# Patient Record
Sex: Female | Born: 1950 | Race: White | Marital: Married | Smoking: Former smoker
Health system: Southern US, Community
[De-identification: ages and names within clinical notes are randomized; demographics above are authoritative.]

## PROBLEM LIST (undated history)

## (undated) DIAGNOSIS — K5289 Other specified noninfective gastroenteritis and colitis: Secondary | ICD-10-CM

## (undated) DIAGNOSIS — Z860100 Personal history of colon polyps, unspecified: Secondary | ICD-10-CM

## (undated) DIAGNOSIS — E785 Hyperlipidemia, unspecified: Secondary | ICD-10-CM

## (undated) DIAGNOSIS — C541 Malignant neoplasm of endometrium: Secondary | ICD-10-CM

## (undated) DIAGNOSIS — M8580 Other specified disorders of bone density and structure, unspecified site: Secondary | ICD-10-CM

## (undated) DIAGNOSIS — K5190 Ulcerative colitis, unspecified, without complications: Secondary | ICD-10-CM

## (undated) HISTORY — DX: Other specified noninfective gastroenteritis and colitis: K52.89

## (undated) HISTORY — DX: Other specified disorders of bone density and structure, unspecified site: M85.80

## (undated) HISTORY — PX: ABDOMINAL HYSTERECTOMY: SHX81

## (undated) HISTORY — DX: Hyperlipidemia, unspecified: E78.5

## (undated) HISTORY — DX: Ulcerative colitis, unspecified, without complications: K51.90

## (undated) HISTORY — PX: BREAST EXCISIONAL BIOPSY: SUR124

## (undated) HISTORY — DX: Personal history of colonic polyps: Z86.010

## (undated) HISTORY — DX: Malignant neoplasm of endometrium: C54.1

## (undated) HISTORY — DX: Personal history of colon polyps, unspecified: Z86.0100

---

## 2012-09-14 ENCOUNTER — Encounter: Payer: Self-pay | Admitting: Physician Assistant

## 2012-10-18 ENCOUNTER — Ambulatory Visit (INDEPENDENT_AMBULATORY_CARE_PROVIDER_SITE_OTHER): Payer: BC Managed Care – PPO | Admitting: Gastroenterology

## 2012-10-18 ENCOUNTER — Encounter: Payer: Self-pay | Admitting: Gastroenterology

## 2012-10-18 VITALS — BP 120/68 | HR 72 | Wt 128.8 lb

## 2012-10-18 DIAGNOSIS — K5190 Ulcerative colitis, unspecified, without complications: Secondary | ICD-10-CM

## 2012-10-18 NOTE — Progress Notes (Signed)
HPI: This is a     very pleasant 61 year old woman whom I am meeting for the first time today.  Recently moved from [place].  Retired Pharmacist, hospital.  Was diagnosed with UC in 1986, with diarrhea, bloody at times  Currently on lialda since 2007, previously on sulfasalazine but weaned off.  Uterine cancer, treated with pelvic radiation and surgery.  Did not require chemo.  Radiation was in 2006.  I reviewed colonoscopy reports from annually 2007 through 2012. His last colonoscopy was December 2012. These have all shown "evidence of colitis in the colon, multiple different segments. I never see any documentation severe he macroscopic inflammation. 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 No evidence of granuloma.  On 2 pills lialda once a day.  On that regimine she is fine.  No bleeding, no diarrhea.  Still has diet related issues with BMs.  Usually one formed BM daily.  Rarely takes NSAIDs.  Difficulty with bleeding hemorrhoid.  No pain, no itching.  Will use suppository periodically, and these help.    Review of systems: Pertinent positive and negative review of systems were noted in the above HPI section. Complete review of systems was performed and was otherwise normal.    Past Medical History  Diagnosis Date  . Other and unspecified noninfectious gastroenteritis and colitis   . History of colon polyps   . Endometrial cancer     Past Surgical History  Procedure Date  . Abdominal hysterectomy     Current Outpatient Prescriptions  Medication Sig Dispense Refill  . calcium carbonate (OS-CAL) 600 MG TABS Take 600 mg by mouth daily.      . cholecalciferol (VITAMIN D) 1000 UNITS tablet Take 1,000 Units by mouth daily.      . mesalamine (LIALDA) 1.2 G EC tablet Take 1,200 mg by mouth 3 (three) times daily.      . Multiple Vitamin (MULTIVITAMIN) tablet Take 1 tablet by mouth daily.      . Psyllium (FIBER)  0.52 G CAPS Take 2 capsules by mouth daily.        Allergies as of 10/18/2012  . (No Known Allergies)    Family History  Problem Relation Age of Onset  . Cancer Mother     Blood    History   Social History  . Marital Status: Married    Spouse Name: N/A    Number of Children: 2  . Years of Education: N/A   Occupational History  . Retired    Social History Main Topics  . Smoking status: Former Research scientist (life sciences)  . Smokeless tobacco: Never Used  . Alcohol Use: Yes     Comment: wine  . Drug Use: No  . Sexually Active: Not on file   Other Topics Concern  . Not on file   Social History Narrative  . No narrative on file       Physical Exam: BP 120/68  Pulse 72  Wt 128 lb 12.8 oz (58.423 kg) Constitutional: generally well-appearing Psychiatric: alert and oriented x3 Eyes: extraocular movements intact Mouth: oral pharynx moist, no lesions Neck: supple no lymphadenopathy Cardiovascular: heart regular rate and rhythm Lungs: clear to auscultation bilaterally Abdomen: soft, nontender, nondistended, no obvious ascites, no peritoneal signs, normal bowel sounds Extremities: no lower extremity edema bilaterally Skin: no lesions on visible extremities Rectal exam with femal assistant in the room: 2-3 small nonthrombosed external hemorrhoids. No clear blood present, internal examination found no  distal masses in the rectum.   Assessment and plan: 62 y.o. female with  long-standing ulcerative colitis under very good control on half dose mesalamine  She has been getting colonoscopies once a year every year for the past 6 years. Reviewing literature recently, gastroenterology societies are providing guidelines that showed is safe to space out these surveillance colonoscopies even up to 3 years.   Her symptoms are under very good control currently. She'll continue only Alda at 2 pills per day and I'll refill them when needed. She'll return to see me in 6 months and sooner if needed. I  think it is reasonable to space out her surveillance intervals. We will start with a colonoscopy December 2014 which be 2 years from her last one.

## 2012-10-18 NOTE — Patient Instructions (Addendum)
Continue as needed OTC treatments for hemorrhoids. Return office visit in 6 months. Will refill your lialda (2 pills a day) when needed. Recall colonoscopy 10/2013.

## 2012-11-30 ENCOUNTER — Other Ambulatory Visit: Payer: Self-pay | Admitting: Internal Medicine

## 2012-11-30 DIAGNOSIS — Z1231 Encounter for screening mammogram for malignant neoplasm of breast: Secondary | ICD-10-CM

## 2012-12-26 ENCOUNTER — Ambulatory Visit
Admission: RE | Admit: 2012-12-26 | Discharge: 2012-12-26 | Disposition: A | Discharge status: Home / Self Care | Payer: BC Managed Care – PPO | Source: Ambulatory Visit | Attending: Internal Medicine | Admitting: Internal Medicine

## 2012-12-26 DIAGNOSIS — Z1231 Encounter for screening mammogram for malignant neoplasm of breast: Secondary | ICD-10-CM

## 2013-04-10 ENCOUNTER — Ambulatory Visit: Payer: BC Managed Care – PPO | Admitting: Gastroenterology

## 2013-04-28 ENCOUNTER — Encounter: Payer: Self-pay | Admitting: Gastroenterology

## 2013-04-28 ENCOUNTER — Ambulatory Visit (INDEPENDENT_AMBULATORY_CARE_PROVIDER_SITE_OTHER): Payer: BC Managed Care – PPO | Admitting: Gastroenterology

## 2013-04-28 VITALS — BP 90/60 | HR 76 | Ht 62.25 in | Wt 130.5 lb

## 2013-04-28 DIAGNOSIS — K5190 Ulcerative colitis, unspecified, without complications: Secondary | ICD-10-CM

## 2013-04-28 NOTE — Patient Instructions (Addendum)
Continue on lialda 2 pills daily for now. Colonoscopy December 2014 for surveillance.

## 2013-04-28 NOTE — Progress Notes (Signed)
Review of pertinent gastrointestinal problems: 1. Ulcerative colitis: pan colitis:diagnosed with UC in 1986, with diarrhea, bloody at times Currently on lialda since 2007, previously on sulfasalazine but weaned off. [place] from annually 2007 through 2012. Her last colonoscopy was December 2012. These have all shown "evidence of colitis in the colon, multiple different segments". I never see any documentation severity of macroscopic inflammation. 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 No evidence of granuloma.  Established care with Dr. Scheryl Marten 2013, decided to repeat colonoscopy 12/14 for surveillance, continue lialda.  IBD description:  (location, activity level, extraintestinal manifestations)  Corticosteroid sparing therapy prescribed: yes  Bone loss assessment  Vitamin D 25-OH level (has been done by PCP):   Bone Density Assessment (she does it every 2 years):   TB testing prior to Anti TNF therapy: NA  Hep B surface Ag, Hep B surface Antibody before Anti TNF therapy:  NA  Influenza immunization (annually)  Pneumococcal immunization (once)  Tobacco Screening and counciling (non smoker)   HPI: This is a very pleasant 62 year old woman whom I last saw November 2013. She has chronic ulcerative colitis under good control on 2.4 g of mesalamine daily.  She has been feeling great.  Only small red blood on TP only.  Solid, formed, brown stools.  Usually once per morning.  arerobics in AM.  Recently had bone density test via PCP, mild osteopenia. Started vit D, calcium supplement   Past Medical History  Diagnosis Date  . Other and unspecified noninfectious gastroenteritis and colitis(558.9)   . History of colon polyps   . Endometrial cancer     Past Surgical History  Procedure Laterality Date  . Abdominal hysterectomy      Current Outpatient Prescriptions  Medication Sig  Dispense Refill  . calcium carbonate (OS-CAL) 600 MG TABS Take 600 mg by mouth daily.      . cholecalciferol (VITAMIN D) 1000 UNITS tablet Take 1,000 Units by mouth daily.      . mesalamine (LIALDA) 1.2 G EC tablet Take 2.4 g by mouth daily.       . Multiple Vitamin (MULTIVITAMIN) tablet Take 1 tablet by mouth daily.      . Psyllium (FIBER) 0.52 G CAPS Take 2 capsules by mouth daily.       No current facility-administered medications for this visit.    Allergies as of 04/28/2013  . (No Known Allergies)    Family History  Problem Relation Age of Onset  . Cancer Mother     Blood    History   Social History  . Marital Status: Married    Spouse Name: N/A    Number of Children: 2  . Years of Education: N/A   Occupational History  . Retired    Social History Main Topics  . Smoking status: Former Research scientist (life sciences)  . Smokeless tobacco: Never Used  . Alcohol Use: Yes     Comment: wine  . Drug Use: No  . Sexually Active: Not on file   Other Topics Concern  . Not on file   Social History Narrative  . No narrative on file      Physical Exam: BP 90/60  Pulse 76  Ht 5' 2.25" (1.581 m)  Wt 130 lb 8 oz (59.194 kg)  BMI 23.68 kg/m2 Constitutional: generally well-appearing Psychiatric: alert and oriented x3 Abdomen: soft, nontender, nondistended, no obvious ascites, no peritoneal signs, normal bowel sounds  Assessment and plan: 62 y.o. female with chronic ulcerative colitis under good control on mesalamine  She will continue on lialda 2.4 g once daily. She is due for surveillance colonoscopy later this year. I see no reason for any further testing prior to then.

## 2013-08-17 ENCOUNTER — Encounter: Payer: Self-pay | Admitting: Gastroenterology

## 2013-08-22 ENCOUNTER — Encounter: Payer: Self-pay | Admitting: Gastroenterology

## 2013-09-12 ENCOUNTER — Other Ambulatory Visit: Payer: Self-pay | Admitting: Internal Medicine

## 2013-09-12 DIAGNOSIS — R102 Pelvic and perineal pain: Secondary | ICD-10-CM

## 2013-09-12 DIAGNOSIS — R7989 Other specified abnormal findings of blood chemistry: Secondary | ICD-10-CM

## 2013-09-14 ENCOUNTER — Other Ambulatory Visit: Payer: Self-pay | Admitting: Internal Medicine

## 2013-09-14 ENCOUNTER — Ambulatory Visit
Admission: RE | Admit: 2013-09-14 | Discharge: 2013-09-14 | Disposition: A | Discharge status: Home / Self Care | Payer: BC Managed Care – PPO | Source: Ambulatory Visit | Attending: Internal Medicine | Admitting: Internal Medicine

## 2013-09-14 DIAGNOSIS — R7989 Other specified abnormal findings of blood chemistry: Secondary | ICD-10-CM

## 2013-09-14 DIAGNOSIS — K7689 Other specified diseases of liver: Secondary | ICD-10-CM

## 2013-09-14 MED ORDER — GADOXETATE DISODIUM 0.25 MMOL/ML IV SOLN
6.0000 mL | Freq: Once | INTRAVENOUS | Status: AC | PRN
  Administered 2013-09-14: 6 mL via INTRAVENOUS

## 2013-09-22 ENCOUNTER — Other Ambulatory Visit: Payer: BC Managed Care – PPO

## 2013-10-06 ENCOUNTER — Ambulatory Visit (AMBULATORY_SURGERY_CENTER): Payer: Self-pay | Admitting: *Deleted

## 2013-10-06 VITALS — Ht 62.0 in | Wt 129.2 lb

## 2013-10-06 DIAGNOSIS — K5190 Ulcerative colitis, unspecified, without complications: Secondary | ICD-10-CM

## 2013-10-06 MED ORDER — MOVIPREP 100 G PO SOLR: 1.0000 | Freq: Once | ORAL | Status: DC

## 2013-10-06 NOTE — Progress Notes (Signed)
No egg or soy allergy. No anesthesia problems.  

## 2013-10-09 ENCOUNTER — Encounter: Payer: Self-pay | Admitting: Gastroenterology

## 2013-10-27 ENCOUNTER — Ambulatory Visit (AMBULATORY_SURGERY_CENTER): Payer: BC Managed Care – PPO | Admitting: Gastroenterology

## 2013-10-27 ENCOUNTER — Encounter: Payer: Self-pay | Admitting: Gastroenterology

## 2013-10-27 VITALS — BP 110/73 | HR 63 | Temp 96.9°F | Resp 18 | Ht 62.0 in | Wt 129.0 lb

## 2013-10-27 DIAGNOSIS — K5190 Ulcerative colitis, unspecified, without complications: Secondary | ICD-10-CM

## 2013-10-27 DIAGNOSIS — R933 Abnormal findings on diagnostic imaging of other parts of digestive tract: Secondary | ICD-10-CM

## 2013-10-27 MED ORDER — MESALAMINE 1.2 G PO TBEC: 4.8000 g | DELAYED_RELEASE_TABLET | Freq: Every day | ORAL | Status: DC

## 2013-10-27 MED ORDER — MESALAMINE 1.2 G PO TBEC: 1.2000 g | DELAYED_RELEASE_TABLET | Freq: Every day | ORAL | Status: DC

## 2013-10-27 MED ORDER — SODIUM CHLORIDE 0.9 % IV SOLN: 500.0000 mL | INTRAVENOUS | Status: DC

## 2013-10-27 MED ORDER — MESALAMINE 1.2 G PO TBEC: 2.4000 g | DELAYED_RELEASE_TABLET | Freq: Every day | ORAL | Status: DC

## 2013-10-27 NOTE — Op Note (Signed)
[facility]  [address]. [place], [address]   COLONOSCOPY PROCEDURE REPORT  PATIENT: Autumn Knight, Autumn Knight  MR#: [id number] BIRTHDATE: [birth year] , 62  yrs. old GENDER: Female ENDOSCOPIST: Milus Banister, MD PROCEDURE DATE:  10/27/2013 PROCEDURE:   Colonoscopy with biopsy First Screening Colonoscopy - Avg.  risk and is 50 yrs.  old or older - No.  Prior Negative Screening - Now for repeat screening. N/A  History of Adenoma - Now for follow-up colonoscopy & has been > or = to 3 yrs.  N/A  Polyps Removed Today? No.  Recommend repeat exam, <10 yrs? Yes.  High risk (family or personal hx). ASA CLASS:   Class II INDICATIONS:ulcerative colitis since 1986. MEDICATIONS: Fentanyl 75 mcg IV, Versed 7 mg IV, and These medications were titrated to patient response per physician's verbal order  DESCRIPTION OF PROCEDURE:   After the risks benefits and alternatives of the procedure were thoroughly explained, informed consent was obtained.  A digital rectal exam revealed no abnormalities of the rectum.   The LB [id number]  endoscope was introduced through the anus and advanced to the terminal ileum which was intubated for a short distance. No adverse events experienced.   The quality of the prep was excellent.  The instrument was then slowly withdrawn as the colon was fully examined.  COLON FINDINGS: The terminal ileum was normal.  The right colon was mildly to moderately inflamed.  Multiple biopsies were randomly taken and sent to pathology.  The left colon mucosa was normal appearing, random biopsies were also taken and sent to pathology. The examination was otherwise normal.  Retroflexed views revealed no abnormalities. The time to cecum=7 minutes 39 seconds. Withdrawal time=9 minutes 59 seconds.  The scope was withdrawn and the procedure completed. COMPLICATIONS: There were no complications.  ENDOSCOPIC IMPRESSION: The terminal ileum was normal.  The right colon was  mildly to moderately inflamed.  Multiple biopsies were randomly taken and sent to pathology.  The left colon mucosa was normal appearing, random biopsies were also taken and sent to pathology.  The examination was otherwise normal.  RECOMMENDATIONS: Await biopsy results.  Given the overt inflammation, I will likely be recommending that you increase your lialda (to 4.8gm per day). Surveillance colonoscopy in 2-3 years pending results as well.   eSigned:  Milus Banister, MD 10/27/2013 11:18 AM   cc: Deland Pretty, MD

## 2013-10-27 NOTE — Progress Notes (Signed)
Patient did not have preoperative order for IV antibiotic SSI prophylaxis. (G8918)  Patient did not experience any of the following events: a burn prior to discharge; a fall within the facility; wrong site/side/patient/procedure/implant event; or a hospital transfer or hospital admission upon discharge from the facility. (G8907)  

## 2013-10-27 NOTE — Patient Instructions (Addendum)
YOU HAD AN ENDOSCOPIC PROCEDURE TODAY AT [facility]: Refer to the procedure report that was given to you for any specific questions about what was found during the examination.  If the procedure report does not answer your questions, please call your gastroenterologist to clarify.  If you requested that your care partner not be given the details of your procedure findings, then the procedure report has been included in a sealed envelope for you to review at your convenience later.  YOU SHOULD EXPECT: Some feelings of bloating in the abdomen. Passage of more gas than usual.  Walking can help get rid of the air that was put into your GI tract during the procedure and reduce the bloating. If you had a lower endoscopy (such as a colonoscopy or flexible sigmoidoscopy) you may notice spotting of blood in your stool or on the toilet paper. If you underwent a bowel prep for your procedure, then you may not have a normal bowel movement for a few days.  DIET: Your first meal following the procedure should be a light meal and then it is ok to progress to your normal diet.  A half-sandwich or bowl of soup is an example of a good first meal.  Heavy or fried foods are harder to digest and may make you feel nauseous or bloated.  Likewise meals heavy in dairy and vegetables can cause extra gas to form and this can also increase the bloating.  Drink plenty of fluids but you should avoid alcoholic beverages for 24 hours.  ACTIVITY: Your care partner should take you home directly after the procedure.  You should plan to take it easy, moving slowly for the rest of the day.  You can resume normal activity the day after the procedure however you should NOT DRIVE or use heavy machinery for 24 hours (because of the sedation medicines used during the test).    SYMPTOMS TO REPORT IMMEDIATELY: A gastroenterologist can be reached at any hour.  During normal business hours, 8:30 AM to 5:00 PM Monday through Friday,  call [phone].  After hours and on weekends, please call the GI answering service at [phone] who will take a message and have the physician on call contact you.   Following lower endoscopy (colonoscopy or flexible sigmoidoscopy):  Excessive amounts of blood in the stool  Significant tenderness or worsening of abdominal pains  Swelling of the abdomen that is new, acute  Fever of 100F or higher    FOLLOW UP: If any biopsies were taken you will be contacted by phone or by letter within the next 1-3 weeks.  Call your gastroenterologist if you have not heard about the biopsies in 3 weeks.  Our staff will call the home number listed on your records the next business day following your procedure to check on you and address any questions or concerns that you may have at that time regarding the information given to you following your procedure. This is a courtesy call and so if there is no answer at the home number and we have not heard from you through the emergency physician on call, we will assume that you have returned to your regular daily activities without incident.  SIGNATURES/CONFIDENTIALITY: You and/or your care partner have signed paperwork which will be entered into your electronic medical record.  These signatures attest to the fact that that the information above on your After Visit Summary has been reviewed and is understood.  Full responsibility of the confidentiality  of this discharge information lies with you and/or your care-partner.   DR JACOBS RECOMMENDS INCREASING YOUR LIALDA TO 4.8 GM PER DAY :THEREFORE NEW ORDER PUT IN TO YOUR PHARMACY

## 2013-10-30 ENCOUNTER — Telehealth: Payer: Self-pay

## 2013-10-30 NOTE — Telephone Encounter (Signed)
  Follow up Call-  Call back number 10/27/2013  Post procedure Call Back phone  # [phone]  Permission to leave phone message Yes     Patient questions:  Do you have a fever, pain , or abdominal swelling? no Pain Score  0 *  Have you tolerated food without any problems? yes  Have you been able to return to your normal activities? yes  Do you have any questions about your discharge instructions: Diet   no Medications  no Follow up visit  no  Do you have questions or concerns about your Care? no  Actions: * If pain score is 4 or above: No action needed, pain <4.

## 2013-11-03 ENCOUNTER — Other Ambulatory Visit: Payer: Self-pay

## 2013-11-03 DIAGNOSIS — K5190 Ulcerative colitis, unspecified, without complications: Secondary | ICD-10-CM

## 2013-11-03 MED ORDER — MESALAMINE 1.2 G PO TBEC: 2.4000 g | DELAYED_RELEASE_TABLET | Freq: Two times a day (BID) | ORAL | Status: DC

## 2013-12-04 ENCOUNTER — Other Ambulatory Visit: Payer: Self-pay

## 2013-12-04 DIAGNOSIS — Z1231 Encounter for screening mammogram for malignant neoplasm of breast: Secondary | ICD-10-CM

## 2013-12-27 ENCOUNTER — Ambulatory Visit
Admission: RE | Admit: 2013-12-27 | Discharge: 2013-12-27 | Disposition: A | Discharge status: Home / Self Care | Payer: Self-pay | Source: Ambulatory Visit

## 2013-12-27 DIAGNOSIS — Z1231 Encounter for screening mammogram for malignant neoplasm of breast: Secondary | ICD-10-CM

## 2014-01-08 ENCOUNTER — Ambulatory Visit: Payer: BC Managed Care – PPO | Admitting: Gastroenterology

## 2014-01-29 ENCOUNTER — Ambulatory Visit (INDEPENDENT_AMBULATORY_CARE_PROVIDER_SITE_OTHER): Payer: BC Managed Care – PPO | Admitting: Gastroenterology

## 2014-01-29 ENCOUNTER — Encounter: Payer: Self-pay | Admitting: Gastroenterology

## 2014-01-29 VITALS — BP 100/58 | HR 76 | Ht 62.25 in | Wt 135.4 lb

## 2014-01-29 DIAGNOSIS — K5190 Ulcerative colitis, unspecified, without complications: Secondary | ICD-10-CM

## 2014-01-29 NOTE — Progress Notes (Signed)
Review of pertinent gastrointestinal problems:  1. Ulcerative colitis: pan colitis:diagnosed with UC in 1986, with diarrhea, bloody at times Currently on lialda since 2007, previously on sulfasalazine but weaned off. [place] from annually 2007 through 2012. Her last colonoscopy was December 2012. These have all shown "evidence of colitis in the colon, multiple different segments". I never see any documentation severity of macroscopic inflammation. 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 No evidence of granuloma. Established care with Dr. Scheryl Knight 2013, decided to repeat colonoscopy 12/14 for surveillance, continue lialda.. Colonoscopy 10/2013 Autumn Knight The terminal ileum was normal. The right colon was mildly to moderately inflamed. Multiple biopsies were randomly taken and sent to pathology. The left colon mucosa was normal appearing, random biopsies were also taken and sent to pathology.  Biopsies from right and left colon showed "mildly active, chronic colitis;" lialda was doubled to 4.8 grams daily  Corticosteroid sparing therapy prescribed: yes  Bone loss assessment  Vitamin D 25-OH level (has been done by PCP):  Bone Density Assessment (she does it every 2 years):  TB testing prior to Anti TNF therapy: NA  Hep B surface Ag, Hep B surface Antibody before Anti TNF therapy: NA  Influenza immunization (annually)  Pneumococcal immunization (once)  Tobacco Screening and counciling (non smoker)  HPI: This is a   very pleasant 63 year old woman whom I last saw about 3 months ago.  Prior to colonoscopy, increase in lialda in dec 2014.  Was going once daily.  Formed brown, non-bloody stools.    Has been taking 4 pills lialda once daily.  Her bowels are still just as good as they were before she made the increase in her mesalamine.     Past Medical History  Diagnosis Date  . Other and unspecified  noninfectious gastroenteritis and colitis(558.9)   . History of colon polyps   . Endometrial cancer   . Osteopenia   . Ulcerative colitis     Past Surgical History  Procedure Laterality Date  . Abdominal hysterectomy      Current Outpatient Prescriptions  Medication Sig Dispense Refill  . calcium carbonate (OS-CAL) 600 MG TABS Take 600 mg by mouth daily.      . cholecalciferol (VITAMIN D) 1000 UNITS tablet Take 1,000 Units by mouth daily.      . mesalamine (LIALDA) 1.2 G EC tablet Take 2 tablets (2.4 g total) by mouth 2 (two) times daily.  120 tablet  11  . Multiple Vitamin (MULTIVITAMIN) tablet Take 1 tablet by mouth daily.      . Psyllium (FIBER) 0.52 G CAPS Take 2 capsules by mouth daily.       No current facility-administered medications for this visit.    Allergies as of 01/29/2014  . (No Known Allergies)    Family History  Problem Relation Age of Onset  . Cancer Mother     Blood  . Colon cancer Neg Hx     History   Social History  . Marital Status: Married    Spouse Name: N/A    Number of Children: 2  . Years of Education: N/A   Occupational History  . Retired    Social History Main Topics  . Smoking status: Former Research scientist (life sciences)  . Smokeless tobacco: Never Used  . Alcohol Use: Yes     Comment: 1 glass per week wine  . Drug Use: No  . Sexual Activity: Not on file   Other  Topics Concern  . Not on file   Social History Narrative  . No narrative on file      Physical Exam: BP 100/58  Pulse 76  Ht 5' 2.25" (1.581 m)  Wt 135 lb 6 oz (61.406 kg)  BMI 24.57 kg/m2 Constitutional: generally well-appearing Psychiatric: alert and oriented x3 Abdomen: soft, nontender, nondistended, no obvious ascites, no peritoneal signs, normal bowel sounds     Assessment and plan: 63 y.o. female with ulcerative colitis since the 1980s  She will stay on full strength we all the 4 pills daily for now. I would like to see her back in one year and sooner if needed. She  will need recall colonoscopy in about 2 years.

## 2014-01-29 NOTE — Patient Instructions (Signed)
Recall colonoscopy in 10/2015 for ulcerative colitis. Stay on lialda, 4 pills daily. Please return to see Dr. Ardis Hughs in 1 year, sooner if needed.

## 2014-09-20 ENCOUNTER — Other Ambulatory Visit: Payer: Self-pay | Admitting: Obstetrics and Gynecology

## 2014-09-25 LAB — CYTOLOGY - PAP

## 2014-11-13 ENCOUNTER — Other Ambulatory Visit: Payer: Self-pay | Admitting: Gastroenterology

## 2014-11-28 ENCOUNTER — Other Ambulatory Visit: Payer: Self-pay

## 2014-11-28 DIAGNOSIS — Z1231 Encounter for screening mammogram for malignant neoplasm of breast: Secondary | ICD-10-CM

## 2014-12-17 ENCOUNTER — Other Ambulatory Visit: Payer: Self-pay | Admitting: Gastroenterology

## 2015-01-09 ENCOUNTER — Ambulatory Visit
Admission: RE | Admit: 2015-01-09 | Discharge: 2015-01-09 | Disposition: A | Discharge status: Home / Self Care | Payer: BLUE CROSS/BLUE SHIELD | Source: Ambulatory Visit

## 2015-01-09 DIAGNOSIS — Z1231 Encounter for screening mammogram for malignant neoplasm of breast: Secondary | ICD-10-CM

## 2015-01-14 ENCOUNTER — Other Ambulatory Visit: Payer: Self-pay | Admitting: Gastroenterology

## 2015-02-16 ENCOUNTER — Other Ambulatory Visit: Payer: Self-pay | Admitting: Gastroenterology

## 2015-03-22 ENCOUNTER — Other Ambulatory Visit: Payer: Self-pay | Admitting: Gastroenterology

## 2015-04-09 ENCOUNTER — Encounter: Payer: Self-pay | Admitting: Gastroenterology

## 2015-04-09 ENCOUNTER — Ambulatory Visit (INDEPENDENT_AMBULATORY_CARE_PROVIDER_SITE_OTHER): Payer: BLUE CROSS/BLUE SHIELD | Admitting: Gastroenterology

## 2015-04-09 VITALS — BP 104/64 | HR 60 | Ht 62.0 in | Wt 134.2 lb

## 2015-04-09 DIAGNOSIS — K5190 Ulcerative colitis, unspecified, without complications: Secondary | ICD-10-CM

## 2015-04-09 MED ORDER — MESALAMINE 1.2 G PO TBEC: DELAYED_RELEASE_TABLET | ORAL | Status: DC

## 2015-04-09 NOTE — Patient Instructions (Addendum)
Lialda prescription called in. Recall colonoscopy 10/2015 for longstanding UC.

## 2015-04-09 NOTE — Progress Notes (Signed)
Review of  gastrointestinal problems:   1. Ulcerative colitis: pan colitis:diagnosed with UC in 1986, with diarrhea, bloody at times Currently on lialda since 2007, previously on sulfasalazine but weaned off. [place] from annually 2007 through 2012. Her last colonoscopy was December 2012. These have all shown "evidence of colitis in the colon, multiple different segments". I never see any documentation severity of macroscopic inflammation. 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 No evidence of granuloma. Established care with Dr. Scheryl Knight 2013, decided to repeat colonoscopy 12/14 for surveillance, continue lialda.. Colonoscopy 10/2013 Autumn Knight The terminal ileum was normal. The right colon was mildly to moderately inflamed. Multiple biopsies were randomly taken and sent to pathology. The left colon mucosa was normal appearing, random biopsies were also taken and sent to pathology. Biopsies from right and left colon showed "mildly active, chronic colitis;" lialda was doubled to 4.8 grams daily   Corticosteroid sparing therapy prescribed: yes   Bone loss assessment   Vitamin D 25-OH level (has been done by PCP):   Bone Density Assessment (she does it every 2 years):   TB testing prior to Anti TNF therapy: NA   Hep B surface Ag, Hep B surface Antibody before Anti TNF therapy: NA   Influenza immunization (annually)   Pneumococcal immunization (once)   Tobacco Screening and counciling (non smoker)  HPI: This is a  Very  Pleasant 64 yo woman  Chief complaint is   ulcerative colitis  Lialda 4 pills once dailyTaking     had a very rough 67    Mother passed away, Brother diagnosed with ALS  Asked about tumeric.  Has been doing well for 5-6 months: one soft, formed, non-bloody BM in AM only.  Rarely two BMs daily.   Overall doing very well.   No significant abd pains.    Past  Medical History  Diagnosis Date  . Other and unspecified noninfectious gastroenteritis and colitis(558.9)   . History of colon polyps   . Endometrial cancer   . Osteopenia   . Ulcerative colitis     Past Surgical History  Procedure Laterality Date  . Abdominal hysterectomy      Current Outpatient Prescriptions  Medication Sig Dispense Refill  . calcium carbonate (OS-CAL) 600 MG TABS Take 600 mg by mouth daily.    . cholecalciferol (VITAMIN D) 1000 UNITS tablet Take 1,000 Units by mouth daily.    Marland Kitchen LIALDA 1.2 G EC tablet TAKE 2 TABLETS BY MOUTH TWICE DAILY 120 tablet 0  . LIALDA 1.2 G EC tablet TAKE 2 TABLETS BY MOUTH TWICE DAILY 120 tablet 0  . Multiple Vitamin (MULTIVITAMIN) tablet Take 1 tablet by mouth daily.    . Psyllium (FIBER) 0.52 G CAPS Take 2 capsules by mouth daily.    . TURMERIC PO Take by mouth.     No current facility-administered medications for this visit.    Allergies as of 04/09/2015  . (No Known Allergies)    Family History  Problem Relation Age of Onset  . Cancer Mother     Blood  . Colon cancer Neg Hx     History   Social History  . Marital Status: Married    Spouse Name: N/A  . Number of Children: 2  . Years of Education: N/A   Occupational History  . Retired    Social History Main Topics  . Smoking status: Former Research scientist (life sciences)  . Smokeless tobacco: Never Used  .  Alcohol Use: Yes     Comment: 1 glass per week wine  . Drug Use: No  . Sexual Activity: Not on file   Other Topics Concern  . Not on file   Social History Narrative     Physical Exam: BP 104/64 mmHg  Pulse 60  Ht 5' 2"  (1.575 m)  Wt 134 lb 3.2 oz (60.873 kg)  BMI 24.54 kg/m2 Constitutional: generally well-appearing Psychiatric: alert and oriented x3 Abdomen: soft, nontender, nondistended, no obvious ascites, no peritoneal signs, normal bowel sounds   Assessment and plan: 64 y.o. female with   Long standing UC  Doing well.  Will refill lialda.  Colonsocopy later this  year.   Autumn Loffler, MD [facility] 04/09/2015, 1:47 PM

## 2015-05-22 ENCOUNTER — Other Ambulatory Visit: Payer: Self-pay | Admitting: Obstetrics and Gynecology

## 2015-05-23 LAB — CYTOLOGY - PAP

## 2015-08-23 ENCOUNTER — Other Ambulatory Visit: Payer: Self-pay

## 2015-08-23 MED ORDER — MESALAMINE 1.2 G PO TBEC: DELAYED_RELEASE_TABLET | ORAL | Status: DC

## 2015-12-23 ENCOUNTER — Other Ambulatory Visit: Payer: Self-pay

## 2015-12-23 DIAGNOSIS — Z1231 Encounter for screening mammogram for malignant neoplasm of breast: Secondary | ICD-10-CM

## 2016-01-15 ENCOUNTER — Ambulatory Visit
Admission: RE | Admit: 2016-01-15 | Discharge: 2016-01-15 | Disposition: A | Discharge status: Home / Self Care | Payer: BLUE CROSS/BLUE SHIELD | Source: Ambulatory Visit

## 2016-01-15 DIAGNOSIS — Z1231 Encounter for screening mammogram for malignant neoplasm of breast: Secondary | ICD-10-CM

## 2016-04-21 ENCOUNTER — Other Ambulatory Visit: Payer: Self-pay | Admitting: Gastroenterology

## 2016-05-21 ENCOUNTER — Other Ambulatory Visit: Payer: Self-pay | Admitting: Gastroenterology

## 2016-06-22 ENCOUNTER — Other Ambulatory Visit: Payer: Self-pay | Admitting: Gastroenterology

## 2016-07-21 ENCOUNTER — Other Ambulatory Visit: Payer: Self-pay | Admitting: Gastroenterology

## 2016-08-21 ENCOUNTER — Other Ambulatory Visit: Payer: Self-pay | Admitting: Gastroenterology

## 2016-09-03 ENCOUNTER — Encounter: Payer: Self-pay | Admitting: Gastroenterology

## 2016-09-08 ENCOUNTER — Encounter: Payer: Self-pay | Admitting: Gastroenterology

## 2016-09-18 ENCOUNTER — Other Ambulatory Visit: Payer: Self-pay | Admitting: Gastroenterology

## 2016-10-19 ENCOUNTER — Other Ambulatory Visit: Payer: Self-pay | Admitting: Gastroenterology

## 2016-10-22 ENCOUNTER — Ambulatory Visit (AMBULATORY_SURGERY_CENTER): Payer: Self-pay

## 2016-10-22 VITALS — Ht 62.0 in | Wt 135.2 lb

## 2016-10-22 DIAGNOSIS — K5100 Ulcerative (chronic) pancolitis without complications: Secondary | ICD-10-CM

## 2016-10-22 MED ORDER — SUPREP BOWEL PREP KIT 17.5-3.13-1.6 GM/177ML PO SOLN: 1.0000 | Freq: Once | ORAL | 0 refills | Status: AC

## 2016-10-22 NOTE — Progress Notes (Signed)
No allergies to eggs or soy No past problems with a nesthesis No diet meds No home oxygen  Declined emmi  Has invisiline braces

## 2016-11-06 ENCOUNTER — Encounter: Payer: Self-pay | Admitting: Gastroenterology

## 2016-11-06 ENCOUNTER — Ambulatory Visit (AMBULATORY_SURGERY_CENTER): Payer: BLUE CROSS/BLUE SHIELD | Admitting: Gastroenterology

## 2016-11-06 VITALS — BP 122/71 | HR 64 | Temp 95.1°F | Resp 17 | Ht 62.0 in | Wt 135.0 lb

## 2016-11-06 DIAGNOSIS — K5100 Ulcerative (chronic) pancolitis without complications: Secondary | ICD-10-CM

## 2016-11-06 DIAGNOSIS — K6389 Other specified diseases of intestine: Secondary | ICD-10-CM | POA: Diagnosis not present

## 2016-11-06 MED ORDER — SODIUM CHLORIDE 0.9 % IV SOLN: 500.0000 mL | INTRAVENOUS | Status: DC

## 2016-11-06 NOTE — Progress Notes (Signed)
A/ox3 pleased with MAC, report to Penny RN 

## 2016-11-06 NOTE — Progress Notes (Signed)
Called to room to assist during endoscopic procedure.  Patient ID and intended procedure confirmed with present staff. Received instructions for my participation in the procedure from the performing physician.  

## 2016-11-06 NOTE — Patient Instructions (Signed)
YOU HAD AN ENDOSCOPIC PROCEDURE TODAY AT [facility]:   Refer to the procedure report that was given to you for any specific questions about what was found during the examination.  If the procedure report does not answer your questions, please call your gastroenterologist to clarify.  If you requested that your care partner not be given the details of your procedure findings, then the procedure report has been included in a sealed envelope for you to review at your convenience later.  YOU SHOULD EXPECT: Some feelings of bloating in the abdomen. Passage of more gas than usual.  Walking can help get rid of the air that was put into your GI tract during the procedure and reduce the bloating. If you had a lower endoscopy (such as a colonoscopy or flexible sigmoidoscopy) you may notice spotting of blood in your stool or on the toilet paper. If you underwent a bowel prep for your procedure, you may not have a normal bowel movement for a few days.  Please Note:  You might notice some irritation and congestion in your nose or some drainage.  This is from the oxygen used during your procedure.  There is no need for concern and it should clear up in a day or so.  SYMPTOMS TO REPORT IMMEDIATELY:   Following lower endoscopy (colonoscopy or flexible sigmoidoscopy):  Excessive amounts of blood in the stool  Significant tenderness or worsening of abdominal pains  Swelling of the abdomen that is new, acute  Fever of 100F or higher    For urgent or emergent issues, a gastroenterologist can be reached at any hour by calling [phone].   DIET:  We do recommend a small meal at first, but then you may proceed to your regular diet.  Drink plenty of fluids but you should avoid alcoholic beverages for 24 hours.  ACTIVITY:  You should plan to take it easy for the rest of today and you should NOT DRIVE or use heavy machinery until tomorrow (because of the sedation medicines used during the test).     FOLLOW UP: Our staff will call the number listed on your records the next business day following your procedure to check on you and address any questions or concerns that you may have regarding the information given to you following your procedure. If we do not reach you, we will leave a message.  However, if you are feeling well and you are not experiencing any problems, there is no need to return our call.  We will assume that you have returned to your regular daily activities without incident.  If any biopsies were taken you will be contacted by phone or by letter within the next 1-3 weeks.  Please call us at [phone] if you have not heard about the biopsies in 3 weeks.    SIGNATURES/CONFIDENTIALITY: You and/or your care partner have signed paperwork which will be entered into your electronic medical record.  These signatures attest to the fact that that the information above on your After Visit Summary has been reviewed and is understood.  Full responsibility of the confidentiality of this discharge information lies with you and/or your care-partner.   Await biopsy results in letter from Dr. Ardis Hughs

## 2016-11-06 NOTE — Op Note (Signed)
[name] Procedure Date: 11/06/2016 7:30 AM MRN: [id number] Endoscopist: Milus Banister , MD Age: 65 Referring MD:  Date of Birth: [birth year] Gender: Female Account #: [id number] Procedure:                Colonoscopy Indications:              High Risk Screening due to longstanding ulcerative                            colitis: Ulcerative colitis:pan colitis:diagnosed                            with UC in 1986, with diarrhea, bloody at times                            Currently on lialda since 2007, previously on                            sulfasalazine but weaned off. [place] colonoscopy                            reports from annually 2007 through 2012. Her last                            colonoscopy was December 2012. These have all shown                            "evidence of colitis in the colon, multiple                            different segments". I never see any documentation                            severity of macroscopic inflammationI . 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 No evidence of granuloma. Established                            care with Dr. Scheryl Marten 2013, decided to repeat                            colonoscopy 12/14 for surveillance, continue                            lialda..Colonoscopy 12/2014Jacobs The terminal  ileum was normal. The right colon was mildly to                            moderately inflamed. Multiple biopsies were                            randomly taken and sent to pathology. The left                            colon mucosa was normal appearing, random biopsies                            were also taken and sent to pathology. Biopsies                            from right and left colon showed "mildly  active,                            chronic colitis;" lialda was doubled to 4.8 grams                            daily Medicines:                Monitored Anesthesia Care Procedure:                Pre-Anesthesia Assessment:                           - Prior to the procedure, a History and Physical                            was performed, and patient medications and                            allergies were reviewed. The patient's tolerance of                            previous anesthesia was also reviewed. The risks                            and benefits of the procedure and the sedation                            options and risks were discussed with the patient.                            All questions were answered, and informed consent                            was obtained. Prior Anticoagulants: The patient has                            taken no previous anticoagulant or antiplatelet  agents. ASA Grade Assessment: II - A patient with                            mild systemic disease. After reviewing the risks                            and benefits, the patient was deemed in                            satisfactory condition to undergo the procedure.                           After obtaining informed consent, the colonoscope                            was passed under direct vision. Throughout the                            procedure, the patient's blood pressure, pulse, and                            oxygen saturations were monitored continuously. The                            Model CF-HQ190L [id number]) scope was introduced                            through the anus and advanced to the the cecum,                            identified by appendiceal orifice and ileocecal                            valve. The colonoscopy was performed without                            difficulty. The patient tolerated the procedure                            well. The  quality of the bowel preparation was                            excellent. The ileocecal valve, appendiceal                            orifice, and rectum were photographed. Scope In: 7:38:15 AM Scope Out: 7:57:43 AM Scope Withdrawal Time: 0 hours 8 minutes 1 second  Total Procedure Duration: 0 hours 19 minutes 28 seconds  Findings:                 The entire examined colon appeared normal.                           Biopsies were taken with a cold forceps from the  right and left colon, multiple biopsies from each                            segment. Complications:            No immediate complications. Estimated blood loss:                            None. Estimated Blood Loss:     Estimated blood loss: none. Impression:               - The entire examined colon is normal.                           - Multiple biopsies taken from colon to check for                            dysplasia. Recommendation:           - Patient has a contact number available for                            emergencies. The signs and symptoms of potential                            delayed complications were discussed with the                            patient. Return to normal activities tomorrow.                            Written discharge instructions were provided to the                            patient.                           - Resume previous diet.                           - Continue present medications.                           - Repeat colonoscopy is recommended. The                            colonoscopy date will be determined after pathology                            results from today's exam become available for                            review (probably 2-3 years for elevated risk                            screening). Milus Banister, MD 11/06/2016 8:03:03 AM This report has been signed electronically.

## 2016-11-09 ENCOUNTER — Telehealth: Payer: Self-pay | Admitting: *Deleted

## 2016-11-09 NOTE — Telephone Encounter (Signed)
  Follow up Call-  Call back number 11/06/2016 11/06/2016  Post procedure Call Back phone  # [phone] -  Permission to leave phone message Yes Yes  Some recent data might be hidden     Patient questions:  Do you have a fever, pain , or abdominal swelling? No. Pain Score  0 *  Have you tolerated food without any problems? Yes.    Have you been able to return to your normal activities? Yes.    Do you have any questions about your discharge instructions: Diet   No. Medications  No. Follow up visit  No.  Do you have questions or concerns about your Care? No.  Actions: * If pain score is 4 or above: No action needed, pain <4.  Pt. Had left for work information provided via spouse.

## 2016-11-13 ENCOUNTER — Encounter: Payer: Self-pay | Admitting: Gastroenterology

## 2016-11-17 ENCOUNTER — Other Ambulatory Visit: Payer: Self-pay | Admitting: Gastroenterology

## 2016-12-20 ENCOUNTER — Other Ambulatory Visit: Payer: Self-pay | Admitting: Gastroenterology

## 2016-12-29 ENCOUNTER — Other Ambulatory Visit: Payer: Self-pay | Admitting: Obstetrics and Gynecology

## 2016-12-29 DIAGNOSIS — Z1231 Encounter for screening mammogram for malignant neoplasm of breast: Secondary | ICD-10-CM

## 2017-01-20 ENCOUNTER — Other Ambulatory Visit: Payer: Self-pay | Admitting: Gastroenterology

## 2017-02-08 ENCOUNTER — Ambulatory Visit
Admission: RE | Admit: 2017-02-08 | Discharge: 2017-02-08 | Disposition: A | Discharge status: Home / Self Care | Payer: BLUE CROSS/BLUE SHIELD | Source: Ambulatory Visit | Attending: Obstetrics and Gynecology | Admitting: Obstetrics and Gynecology

## 2017-02-08 DIAGNOSIS — Z1231 Encounter for screening mammogram for malignant neoplasm of breast: Secondary | ICD-10-CM

## 2017-02-18 ENCOUNTER — Other Ambulatory Visit: Payer: Self-pay | Admitting: Gastroenterology

## 2017-03-18 ENCOUNTER — Other Ambulatory Visit: Payer: Self-pay | Admitting: Gastroenterology

## 2017-04-20 ENCOUNTER — Other Ambulatory Visit: Payer: Self-pay | Admitting: Gastroenterology

## 2017-05-30 ENCOUNTER — Other Ambulatory Visit: Payer: Self-pay | Admitting: Gastroenterology

## 2017-07-01 ENCOUNTER — Other Ambulatory Visit: Payer: Self-pay | Admitting: Gastroenterology

## 2017-07-29 ENCOUNTER — Other Ambulatory Visit: Payer: Self-pay | Admitting: Gastroenterology

## 2017-08-30 ENCOUNTER — Other Ambulatory Visit: Payer: Self-pay | Admitting: Gastroenterology

## 2017-09-30 ENCOUNTER — Other Ambulatory Visit: Payer: Self-pay | Admitting: Gastroenterology

## 2017-10-30 ENCOUNTER — Other Ambulatory Visit: Payer: Self-pay | Admitting: Gastroenterology

## 2017-11-03 ENCOUNTER — Telehealth: Payer: Self-pay | Admitting: Gastroenterology

## 2017-11-03 MED ORDER — LIALDA 1.2 G PO TBEC: 4.8000 g | DELAYED_RELEASE_TABLET | Freq: Every day | ORAL | 1 refills | Status: DC

## 2017-11-03 NOTE — Telephone Encounter (Signed)
Medication sent in for Lialda 4 tablet daily #120 with 1 refill until patient's appt 12/28/17.

## 2017-12-28 ENCOUNTER — Encounter: Payer: Self-pay | Admitting: Gastroenterology

## 2017-12-28 ENCOUNTER — Ambulatory Visit: Payer: Medicare HMO | Admitting: Gastroenterology

## 2017-12-28 VITALS — BP 110/68 | HR 72 | Ht 62.0 in | Wt 135.4 lb

## 2017-12-28 DIAGNOSIS — K5100 Ulcerative (chronic) pancolitis without complications: Secondary | ICD-10-CM | POA: Diagnosis not present

## 2017-12-28 MED ORDER — LIALDA 1.2 G PO TBEC: 4.8000 g | DELAYED_RELEASE_TABLET | Freq: Every day | ORAL | 3 refills | Status: DC

## 2017-12-28 NOTE — Progress Notes (Signed)
Review of  gastrointestinal problems:   1. Ulcerative colitis: pan colitis:diagnosed with UC in 1986, with diarrhea, bloody at times Currently on lialda since 2007, previously on sulfasalazine but weaned off. [place] from annually 2007 through 2012. Her last colonoscopy was December 2012. These have all shown "evidence of colitis in the colon, multiple different segments". I never see any documentation severity of macroscopic inflammation. 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 No evidence of granuloma. Established care with Dr. Scheryl Marten 2013, decided to repeat colonoscopy 12/14 for surveillance, continue lialda.. Colonoscopy 10/2013 Ardis Hughs The terminal ileum was normal. The right colon was mildly to moderately inflamed. Multiple biopsies were randomly taken and sent to pathology. The left colon mucosa was normal appearing, random biopsies were also taken and sent to pathology. Biopsies from right and left colon showed "mildly active, chronic colitis;" lialda was doubled to 4.8 grams daily  Colonoscopy 10/2016 was normal.  Mulitple biopsies taken, all normal on path.    HPI: This is a very pleasant 67 year old woman whom I last saw the time of a colonoscopy a little over a year ago at the time of a colonoscopy.  See those results above  Chief complaint is pancolitis, long-standing  Takes lialda 4 pills once daily and feels absolutely fine.  NO gi issues.  No significant diarrhea  Overall stable weight. No bleeding,   ROS: complete GI ROS as described in HPI, all other review negative.  Constitutional:  No unintentional weight loss   Past Medical History:  Diagnosis Date  . Endometrial cancer ([place])   . History of colon polyps   . Osteopenia   . Other and unspecified noninfectious gastroenteritis and colitis(558.9)   . Ulcerative colitis [facility])     Past Surgical History:  Procedure  Laterality Date  . ABDOMINAL HYSTERECTOMY      Current Outpatient Medications  Medication Sig Dispense Refill  . calcium carbonate (OS-CAL) 600 MG TABS Take 600 mg by mouth daily.    . cholecalciferol (VITAMIN D) 1000 UNITS tablet Take 1,000 Units by mouth daily.    . Lactobacillus (ACIDOPHILUS PO) Take by mouth. Probiotic    . LIALDA 1.2 g EC tablet Take 4 tablets (4.8 g total) by mouth daily. 120 tablet 1  . Multiple Vitamin (MULTIVITAMIN) tablet Take 1 tablet by mouth daily.    . Psyllium (FIBER) 0.52 G CAPS Take 2 capsules by mouth daily.    . Red Yeast Rice Extract (RED YEAST RICE PO) Take by mouth. 669m 2 daily    . TURMERIC PO Take by mouth.     Current Facility-Administered Medications  Medication Dose Route Frequency Provider Last Rate Last Dose  . 0.9 %  sodium chloride infusion  500 mL Intravenous Continuous JMilus Banister MD        Allergies as of 12/28/2017  . (No Known Allergies)    Family History  Problem Relation Age of Onset  . Cancer Mother        Blood  . Colon cancer Neg Hx   . Stomach cancer Neg Hx   . Rectal cancer Neg Hx   . Pancreatic cancer Neg Hx     Social History   Socioeconomic History  . Marital status: Married    Spouse name: Not on file  . Number of children: 2  . Years of education: Not on file  . Highest education level: Not on file  Social Needs  .  Financial resource strain: Not on file  . Food insecurity - worry: Not on file  . Food insecurity - inability: Not on file  . Transportation needs - medical: Not on file  . Transportation needs - non-medical: Not on file  Occupational History  . Occupation: Retired  Tobacco Use  . Smoking status: Former Smoker    Last attempt to quit: 1978    Years since quitting: 41.1  . Smokeless tobacco: Never Used  Substance and Sexual Activity  . Alcohol use: Yes    Comment: 1 glass per week wine  . Drug use: No  . Sexual activity: Not on file  Other Topics Concern  . Not on file   Social History Narrative  . Not on file     Physical Exam: BP 110/68   Pulse 72   Ht 5' 2"  (1.575 m)   Wt 135 lb 6 oz (61.4 kg)   BMI 24.76 kg/m  Constitutional: generally well-appearing Psychiatric: alert and oriented x3 Abdomen: soft, nontender, nondistended, no obvious ascites, no peritoneal signs, normal bowel sounds No peripheral edema noted in lower extremities  Assessment and plan: 67 y.o. female with long-standing extensive ulcerative colitis  She has no disease evident on colonoscopy or pathology biopsies as of a little over a year ago.  She has no symptoms.  I recommended she stay on the Lialda 4 pills once daily.  She will return to see me in 1 year and sooner if any problems.  I think the soonest she would need repeat colonoscopy would be December 2020 and it would probably be safe to delay it another year or 2 from then pending clinical course.  Please see the "Patient Instructions" section for addition details about the plan.  Owens Loffler, MD [facility] 12/28/2017, 9:02 AM

## 2017-12-28 NOTE — Patient Instructions (Signed)
Refills on lialda, 4 pills once daily, disp 3 months, with 3 refills.  Please return to see Dr. Ardis Hughs in 1 year. Call sooner if any issues.

## 2017-12-31 ENCOUNTER — Other Ambulatory Visit: Payer: Self-pay | Admitting: Obstetrics and Gynecology

## 2017-12-31 DIAGNOSIS — Z1231 Encounter for screening mammogram for malignant neoplasm of breast: Secondary | ICD-10-CM

## 2018-02-14 ENCOUNTER — Ambulatory Visit
Admission: RE | Admit: 2018-02-14 | Discharge: 2018-02-14 | Disposition: A | Discharge status: Home / Self Care | Payer: Medicare HMO | Source: Ambulatory Visit | Attending: Obstetrics and Gynecology | Admitting: Obstetrics and Gynecology

## 2018-02-14 DIAGNOSIS — Z1231 Encounter for screening mammogram for malignant neoplasm of breast: Secondary | ICD-10-CM | POA: Diagnosis not present

## 2018-03-28 DIAGNOSIS — H52203 Unspecified astigmatism, bilateral: Secondary | ICD-10-CM | POA: Diagnosis not present

## 2018-03-28 DIAGNOSIS — H1859 Other hereditary corneal dystrophies: Secondary | ICD-10-CM | POA: Diagnosis not present

## 2018-03-28 DIAGNOSIS — H524 Presbyopia: Secondary | ICD-10-CM | POA: Diagnosis not present

## 2018-06-08 DIAGNOSIS — Z6823 Body mass index (BMI) 23.0-23.9, adult: Secondary | ICD-10-CM | POA: Diagnosis not present

## 2018-06-08 DIAGNOSIS — Z01419 Encounter for gynecological examination (general) (routine) without abnormal findings: Secondary | ICD-10-CM | POA: Diagnosis not present

## 2018-09-01 DIAGNOSIS — R69 Illness, unspecified: Secondary | ICD-10-CM | POA: Diagnosis not present

## 2018-10-25 DIAGNOSIS — E559 Vitamin D deficiency, unspecified: Secondary | ICD-10-CM | POA: Diagnosis not present

## 2018-10-25 DIAGNOSIS — E7800 Pure hypercholesterolemia, unspecified: Secondary | ICD-10-CM | POA: Diagnosis not present

## 2018-10-25 DIAGNOSIS — Z0000 Encounter for general adult medical examination without abnormal findings: Secondary | ICD-10-CM | POA: Diagnosis not present

## 2018-11-01 DIAGNOSIS — E7800 Pure hypercholesterolemia, unspecified: Secondary | ICD-10-CM | POA: Diagnosis not present

## 2018-11-01 DIAGNOSIS — Z0000 Encounter for general adult medical examination without abnormal findings: Secondary | ICD-10-CM | POA: Diagnosis not present

## 2018-11-29 DIAGNOSIS — H6123 Impacted cerumen, bilateral: Secondary | ICD-10-CM | POA: Diagnosis not present

## 2018-12-21 ENCOUNTER — Other Ambulatory Visit: Payer: Self-pay | Admitting: Gastroenterology

## 2018-12-21 DIAGNOSIS — H1032 Unspecified acute conjunctivitis, left eye: Secondary | ICD-10-CM | POA: Diagnosis not present

## 2018-12-30 ENCOUNTER — Encounter (INDEPENDENT_AMBULATORY_CARE_PROVIDER_SITE_OTHER): Payer: Self-pay

## 2018-12-30 ENCOUNTER — Ambulatory Visit: Payer: Medicare HMO | Admitting: Gastroenterology

## 2018-12-30 ENCOUNTER — Encounter: Payer: Self-pay | Admitting: Gastroenterology

## 2018-12-30 VITALS — BP 118/74 | HR 70 | Ht 62.25 in | Wt 136.0 lb

## 2018-12-30 DIAGNOSIS — K5100 Ulcerative (chronic) pancolitis without complications: Secondary | ICD-10-CM | POA: Diagnosis not present

## 2018-12-30 MED ORDER — MESALAMINE 1.2 G PO TBEC: DELAYED_RELEASE_TABLET | ORAL | 3 refills | Status: DC

## 2018-12-30 NOTE — Progress Notes (Signed)
Review of gastrointestinal problems:  1. Ulcerative colitis:pan colitis:diagnosed with UC in 1986, with diarrhea, bloody at times Currently on lialda since 2007, previously on sulfasalazine but weaned off. [place] from [place] 2007 through 2012. Her last colonoscopy was December 2012. These have all shown "evidence of colitis in the colon, multiple different segments". I never see any documentation severity of macroscopic inflammation. 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 No evidence of granuloma. Established care with Dr. Scheryl Marten 2013, decided to repeat colonoscopy 12/14 for surveillance, continue lialda..Colonoscopy 12/2014Jacobs The terminal ileum was normal. The right colon was mildly to moderately inflamed. Multiple biopsies were randomly taken and sent to pathology. The left colon mucosa was normal appearing, random biopsies were also taken and sent to pathology. Biopsies from right and left colon showed "mildly active, chronic colitis;" lialda was doubled to 4.8 grams daily  Colonoscopy 10/2016 was normal.  Mulitple biopsies taken, all normal on path.  HPI: This is a very pleasant 68 year old woman whom I last saw about 1 year ago  She is here for routine follow-up for her ulcerative colitis.  She takes Lialda 4 pills once daily and on that regimen she feels just fine.  She has 1 bowel movement every morning like clockwork it is solid, nonbloody.  Never constipation very rarely loose stools.  She does have hemorrhoidal type bleeding every 3 or 4 months, bright red streak on the stool.  No abdominal pains.  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  Constitutional:  No unintentional weight loss   Past Medical History:  Diagnosis Date  . Endometrial cancer ([place])   . History of colon polyps   . Osteopenia   . Other and unspecified noninfectious gastroenteritis and colitis(558.9)   . Ulcerative colitis [facility])     Past Surgical History:  Procedure Laterality Date  . ABDOMINAL HYSTERECTOMY    . BREAST EXCISIONAL BIOPSY Left 1980's    Current Outpatient Medications  Medication Sig Dispense Refill  . calcium carbonate (OS-CAL) 600 MG TABS Take 600 mg by mouth daily.    . cholecalciferol (VITAMIN D) 1000 UNITS tablet Take 1,000 Units by mouth daily.    . Lactobacillus (ACIDOPHILUS PO) Take by mouth. Probiotic    . mesalamine (LIALDA) 1.2 g EC tablet TAKE 4 TABLETS(4.8 GRAMS) BY MOUTH DAILY 120 tablet 0  . Multiple Vitamin (MULTIVITAMIN) tablet Take 1 tablet by mouth daily.    . Psyllium (FIBER) 0.52 G CAPS Take 2 capsules by mouth daily.    . Red Yeast Rice Extract (RED YEAST RICE PO) Take by mouth. 628m 2 daily    . TURMERIC PO Take by mouth.     No current facility-administered medications for this visit.     Allergies as of 12/30/2018  . (No Known Allergies)    Family History  Problem Relation Age of Onset  . Cancer Mother        Blood  . Breast cancer Maternal Grandmother 830 . Colon cancer Neg Hx   . Stomach cancer Neg Hx   . Rectal cancer Neg Hx   . Pancreatic cancer Neg Hx     Social History   Socioeconomic History  . Marital status: Married  Spouse name: Not on file  . Number of children: 2  . Years of education: Not on file  . Highest education level: Not on file  Occupational History  . Occupation: Retired  Scientific laboratory technician  . Financial resource strain: Not on file  . Food insecurity:    Worry: Not on file    Inability: Not on file  . Transportation needs:    Medical: Not on file    Non-medical: Not on file  Tobacco Use  . Smoking status: Former Smoker    Last attempt to quit: 1978     Years since quitting: 42.1  . Smokeless tobacco: Never Used  Substance and Sexual Activity  . Alcohol use: Yes    Comment: 1 glass per week wine  . Drug use: No  . Sexual activity: Not on file  Lifestyle  . Physical activity:    Days per week: Not on file    Minutes per session: Not on file  . Stress: Not on file  Relationships  . Social connections:    Talks on phone: Not on file    Gets together: Not on file    Attends religious service: Not on file    Active member of club or organization: Not on file    Attends meetings of clubs or organizations: Not on file    Relationship status: Not on file  . Intimate partner violence:    Fear of current or ex partner: Not on file    Emotionally abused: Not on file    Physically abused: Not on file    Forced sexual activity: Not on file  Other Topics Concern  . Not on file  Social History Narrative  . Not on file     Physical Exam: BP 118/74   Pulse 70   Ht 5' 2.25" (1.581 m)   Wt 136 lb (61.7 kg)   BMI 24.68 kg/m  Constitutional: generally well-appearing Psychiatric: alert and oriented x3 Abdomen: soft, nontender, nondistended, no obvious ascites, no peritoneal signs, normal bowel sounds No peripheral edema noted in lower extremities  Assessment and plan: 68 y.o. female with longstanding ulcerative colitis  Her symptoms are under very good control clinically.  Her last colonoscopy showed normal colon with normal biopsies.  She will continue on mesalamine full strength once daily.  I have given her refills today to last her a year.  She is due for high risk colon cancer screening which we have been doing about every 3 years for her.  That will be later this year December 2020 and we will make sure that she is in the recall system for that.  Please see the "Patient Instructions" section for addition details about the plan.  Owens Loffler, MD [facility] 12/30/2018, 3:44 PM

## 2018-12-30 NOTE — Patient Instructions (Addendum)
Lialda refills, 1.2gm pills, 4 pills once daily, disp 3 months with 3 refills.   You will be due for a recall colonoscopy in 10/24/2019. We will send you a reminder in the mail when it gets closer to that time.  We have sent the following medications to your pharmacy for you to pick up at your convenience:  Lialda  Thank you.

## 2019-01-09 ENCOUNTER — Other Ambulatory Visit: Payer: Self-pay | Admitting: Obstetrics and Gynecology

## 2019-01-09 DIAGNOSIS — Z1231 Encounter for screening mammogram for malignant neoplasm of breast: Secondary | ICD-10-CM

## 2019-01-28 DIAGNOSIS — B351 Tinea unguium: Secondary | ICD-10-CM | POA: Diagnosis not present

## 2019-01-28 DIAGNOSIS — E785 Hyperlipidemia, unspecified: Secondary | ICD-10-CM | POA: Diagnosis not present

## 2019-01-28 DIAGNOSIS — M8580 Other specified disorders of bone density and structure, unspecified site: Secondary | ICD-10-CM | POA: Diagnosis not present

## 2019-01-28 DIAGNOSIS — K5190 Ulcerative colitis, unspecified, without complications: Secondary | ICD-10-CM | POA: Diagnosis not present

## 2019-02-21 ENCOUNTER — Ambulatory Visit: Payer: Medicare HMO

## 2019-04-11 ENCOUNTER — Ambulatory Visit: Payer: Medicare HMO

## 2019-04-21 DIAGNOSIS — H2513 Age-related nuclear cataract, bilateral: Secondary | ICD-10-CM | POA: Diagnosis not present

## 2019-04-21 DIAGNOSIS — H52203 Unspecified astigmatism, bilateral: Secondary | ICD-10-CM | POA: Diagnosis not present

## 2019-05-03 DIAGNOSIS — E559 Vitamin D deficiency, unspecified: Secondary | ICD-10-CM | POA: Diagnosis not present

## 2019-05-03 DIAGNOSIS — E7800 Pure hypercholesterolemia, unspecified: Secondary | ICD-10-CM | POA: Diagnosis not present

## 2019-05-13 ENCOUNTER — Other Ambulatory Visit: Payer: Self-pay | Admitting: Gastroenterology

## 2019-05-15 ENCOUNTER — Telehealth: Payer: Self-pay | Admitting: Gastroenterology

## 2019-05-15 NOTE — Telephone Encounter (Signed)
Patient called for refills. Patient is schedule on 06/07/2019

## 2019-05-16 ENCOUNTER — Other Ambulatory Visit: Payer: Self-pay | Admitting: Gastroenterology

## 2019-05-16 MED ORDER — MESALAMINE 1.2 G PO TBEC: DELAYED_RELEASE_TABLET | ORAL | 8 refills | Status: DC

## 2019-05-16 NOTE — Telephone Encounter (Signed)
Spoke to patient. She does not need to be seen until 2/21. Appointment cancelled refills sent to pharmacy

## 2019-05-22 ENCOUNTER — Ambulatory Visit: Payer: Medicare HMO

## 2019-05-24 ENCOUNTER — Other Ambulatory Visit: Payer: Self-pay

## 2019-05-24 ENCOUNTER — Ambulatory Visit
Admission: RE | Admit: 2019-05-24 | Discharge: 2019-05-24 | Disposition: A | Discharge status: Home / Self Care | Payer: Medicare HMO | Source: Ambulatory Visit | Attending: Obstetrics and Gynecology | Admitting: Obstetrics and Gynecology

## 2019-05-24 DIAGNOSIS — Z1231 Encounter for screening mammogram for malignant neoplasm of breast: Secondary | ICD-10-CM

## 2019-06-07 ENCOUNTER — Ambulatory Visit: Payer: Medicare HMO | Admitting: Gastroenterology

## 2019-06-13 DIAGNOSIS — K5190 Ulcerative colitis, unspecified, without complications: Secondary | ICD-10-CM | POA: Insufficient documentation

## 2019-06-13 DIAGNOSIS — N952 Postmenopausal atrophic vaginitis: Secondary | ICD-10-CM | POA: Diagnosis not present

## 2019-06-13 DIAGNOSIS — Z6823 Body mass index (BMI) 23.0-23.9, adult: Secondary | ICD-10-CM | POA: Diagnosis not present

## 2019-06-13 DIAGNOSIS — Z01419 Encounter for gynecological examination (general) (routine) without abnormal findings: Secondary | ICD-10-CM | POA: Diagnosis not present

## 2019-07-11 ENCOUNTER — Encounter: Payer: Self-pay | Admitting: Podiatry

## 2019-07-11 ENCOUNTER — Ambulatory Visit: Payer: Medicare HMO | Admitting: Podiatry

## 2019-07-11 ENCOUNTER — Other Ambulatory Visit: Payer: Self-pay

## 2019-07-11 VITALS — Temp 97.3°F

## 2019-07-11 DIAGNOSIS — L608 Other nail disorders: Secondary | ICD-10-CM | POA: Diagnosis not present

## 2019-07-11 DIAGNOSIS — L603 Nail dystrophy: Secondary | ICD-10-CM

## 2019-07-11 DIAGNOSIS — B351 Tinea unguium: Secondary | ICD-10-CM | POA: Diagnosis not present

## 2019-07-11 NOTE — Patient Instructions (Signed)
Happy Anniversary!!  If was nice to meet you today. If you have any questions or any further concerns, please feel fee to give me a call. You can call our office at [phone] or please feel fee to send me a message through [place].

## 2019-07-17 NOTE — Progress Notes (Signed)
Subjective:   Patient ID: Autumn Knight, female   DOB: 68 y.o.   MRN: [id number]   HPI 68 year old female presents the office today for concerns of toenail issues.  She feels that the nails are starting to separate from my nail bed with the left side worse than the right.  She states that previously they were discolored and she was using over-the-counter antifungal medicine which did not help the color and nails are still growing within the skin.  No pain in the nails and denies any redness or drainage or any swelling.   Review of Systems  All other systems reviewed and are negative.  Past Medical History:  Diagnosis Date  . Endometrial cancer ([place])   . History of colon polyps   . Osteopenia   . Other and unspecified noninfectious gastroenteritis and colitis(558.9)   . Ulcerative colitis [facility])     Past Surgical History:  Procedure Laterality Date  . ABDOMINAL HYSTERECTOMY    . BREAST EXCISIONAL BIOPSY Left 1980's     Current Outpatient Medications:  .  calcium carbonate (OS-CAL) 600 MG TABS, Take 600 mg by mouth daily., Disp: , Rfl:  .  cholecalciferol (VITAMIN D) 1000 UNITS tablet, Take 1,000 Units by mouth daily., Disp: , Rfl:  .  Lactobacillus (ACIDOPHILUS PO), Take by mouth. Probiotic, Disp: , Rfl:  .  mesalamine (LIALDA) 1.2 g EC tablet, TAKE 4 TABLETS(4.8 GRAMS) BY MOUTH DAILY, Disp: 120 tablet, Rfl: 8 .  Multiple Vitamin (MULTIVITAMIN) tablet, Take 1 tablet by mouth daily., Disp: , Rfl:  .  Psyllium (FIBER) 0.52 G CAPS, Take 2 capsules by mouth daily., Disp: , Rfl:  .  TURMERIC PO, Take by mouth., Disp: , Rfl:   No Known Allergies       Objective:  Physical Exam  General: AAO x3, NAD  Dermatological: Bilateral hallux toenails appear to be improved in color today to pictures that she showed me however with the left side worse than right the nail is missing underlying nail bed on the distal aspect.  There is no pain of the nails there is no redness or drainage or any  signs of infection.  No open lesions.  Vascular: Dorsalis Pedis artery and Posterior Tibial artery pedal pulses are 2/4 bilateral with immedate capillary fill time.  There is no pain with calf compression, swelling, warmth, erythema.   Neruologic: Grossly intact via light touch bilateral.  Protective threshold with Semmes Wienstein monofilament intact to all pedal sites bilateral.   Musculoskeletal: No gross boney pedal deformities bilateral. No pain, crepitus, or limitation noted with foot and ankle range of motion bilateral. Muscular strength 5/5 in all groups tested bilateral.  Gait: Unassisted, Nonantalgic.       Assessment:   Toenail dystrophy, onychomycosis     Plan:  -Treatment options discussed including all alternatives, risks, and complications -We discussed likely etiology of this.  We discussed treatment options.  However prior to starting treatment I did debride the nails to both hallux toenails with any complications or bleeding I sent this for culture, pathology. Sent to [facility].   RTC after nail culture or sooner if needed  Trula Slade DPM

## 2019-07-25 ENCOUNTER — Telehealth: Payer: Self-pay | Admitting: *Deleted

## 2019-07-25 NOTE — Telephone Encounter (Signed)
-----   Message from Trula Slade, DPM sent at 07/25/2019  7:44 AM EDT ----- He culture result didn't get scanned in appropriately. However it did not show fungus. I would recommend urea topically and oral biotin to help nail health. If wanted we can do the topical through [place] for nail fungus that would include urea and the anti-fungal. Now her concern was that the nail had fungus but it was starting to separate. One of the risks of treatment is that the nail wants to come off. The color was looking better. We could also watch the nail and see if it will grow out on its own. Thanks.

## 2019-07-25 NOTE — Telephone Encounter (Signed)
I informed pt of Dr. Leigh Aurora orders and pt states she would like to try the Revitaderm40 and has already begun the Biotin. I placed a Revitaderm40 jar at front reception for pt pick up.

## 2019-07-25 NOTE — Telephone Encounter (Signed)
Pt's mobile phone rang once, then went silent.

## 2019-08-02 DIAGNOSIS — R69 Illness, unspecified: Secondary | ICD-10-CM | POA: Diagnosis not present

## 2019-09-18 ENCOUNTER — Encounter: Payer: Self-pay | Admitting: Gastroenterology

## 2019-10-23 ENCOUNTER — Ambulatory Visit (AMBULATORY_SURGERY_CENTER): Payer: Self-pay

## 2019-10-23 ENCOUNTER — Other Ambulatory Visit: Payer: Self-pay

## 2019-10-23 VITALS — Temp 96.2°F | Ht 62.25 in | Wt 134.0 lb

## 2019-10-23 DIAGNOSIS — K5100 Ulcerative (chronic) pancolitis without complications: Secondary | ICD-10-CM

## 2019-10-23 MED ORDER — NA SULFATE-K SULFATE-MG SULF 17.5-3.13-1.6 GM/177ML PO SOLN: 1.0000 | Freq: Once | ORAL | 0 refills | Status: AC

## 2019-10-23 NOTE — Progress Notes (Signed)
Denies allergies to eggs or soy products. Denies complication of anesthesia or sedation. Denies use of weight loss medication. Denies use of O2.   Emmi instructions given for colonoscopy.  Covid screening is scheduled for Wednesday 11/08/19 @ 11:40.

## 2019-10-31 ENCOUNTER — Encounter: Payer: Self-pay | Admitting: Gastroenterology

## 2019-11-01 DIAGNOSIS — E559 Vitamin D deficiency, unspecified: Secondary | ICD-10-CM | POA: Diagnosis not present

## 2019-11-01 DIAGNOSIS — E7800 Pure hypercholesterolemia, unspecified: Secondary | ICD-10-CM | POA: Diagnosis not present

## 2019-11-01 DIAGNOSIS — Z0000 Encounter for general adult medical examination without abnormal findings: Secondary | ICD-10-CM | POA: Diagnosis not present

## 2019-11-01 DIAGNOSIS — M8580 Other specified disorders of bone density and structure, unspecified site: Secondary | ICD-10-CM | POA: Diagnosis not present

## 2019-11-06 DIAGNOSIS — E7800 Pure hypercholesterolemia, unspecified: Secondary | ICD-10-CM | POA: Diagnosis not present

## 2019-11-06 DIAGNOSIS — M8580 Other specified disorders of bone density and structure, unspecified site: Secondary | ICD-10-CM | POA: Diagnosis not present

## 2019-11-06 DIAGNOSIS — K5190 Ulcerative colitis, unspecified, without complications: Secondary | ICD-10-CM | POA: Diagnosis not present

## 2019-11-06 DIAGNOSIS — Z0000 Encounter for general adult medical examination without abnormal findings: Secondary | ICD-10-CM | POA: Diagnosis not present

## 2019-11-08 ENCOUNTER — Other Ambulatory Visit: Payer: Self-pay | Admitting: Gastroenterology

## 2019-11-08 ENCOUNTER — Ambulatory Visit (INDEPENDENT_AMBULATORY_CARE_PROVIDER_SITE_OTHER): Payer: Medicare HMO

## 2019-11-08 DIAGNOSIS — Z1159 Encounter for screening for other viral diseases: Secondary | ICD-10-CM

## 2019-11-09 LAB — SARS CORONAVIRUS 2 (TAT 6-24 HRS): SARS Coronavirus 2: NEGATIVE

## 2019-11-13 ENCOUNTER — Ambulatory Visit (AMBULATORY_SURGERY_CENTER): Payer: Medicare HMO | Admitting: Gastroenterology

## 2019-11-13 ENCOUNTER — Other Ambulatory Visit: Payer: Self-pay

## 2019-11-13 ENCOUNTER — Encounter: Payer: Self-pay | Admitting: Gastroenterology

## 2019-11-13 VITALS — BP 124/67 | HR 62 | Temp 97.9°F | Resp 14 | Ht 62.25 in | Wt 134.0 lb

## 2019-11-13 DIAGNOSIS — K5100 Ulcerative (chronic) pancolitis without complications: Secondary | ICD-10-CM

## 2019-11-13 DIAGNOSIS — Z86010 Personal history of colonic polyps: Secondary | ICD-10-CM | POA: Diagnosis not present

## 2019-11-13 DIAGNOSIS — E7800 Pure hypercholesterolemia, unspecified: Secondary | ICD-10-CM | POA: Diagnosis not present

## 2019-11-13 DIAGNOSIS — K5190 Ulcerative colitis, unspecified, without complications: Secondary | ICD-10-CM | POA: Diagnosis not present

## 2019-11-13 MED ORDER — SODIUM CHLORIDE 0.9 % IV SOLN: 500.0000 mL | Freq: Once | INTRAVENOUS | Status: DC

## 2019-11-13 NOTE — Op Note (Signed)
[place] Patient Name: Autumn Knight Procedure Date: 11/13/2019 7:52 AM MRN: [id number] Endoscopist: Milus Banister , MD Age: 68 Referring MD:  Date of Birth: Jan 19, 1951 Gender: Female Account #: [id number] Procedure:                Colonoscopy Indications:              High risk colon cancer surveillance: Ulcerative                            colitis:pan colitis:diagnosed with UC in 1986,                            with diarrhea, bloody at times Currently on lialda                            since 2007, previously on sulfasalazine but weaned                            off. [place] colonoscopy reports from annually 2007                            through 2012. Her last colonoscopy was December                            2012. These have all shown "evidence of colitis in                            the colon, multiple different segments". I never                            see any documentation severity of macroscopic                            inflammation. Biopsies were always done and these                            have consistently shown acute and chronic colitis                            with crypt abscesses and cryptitis. No evidence of                            granuloma. Established care with Dr. Scheryl Marten                            2013, decided to repeat colonoscopy 12/14 for                            surveillance, continue lialda..Colonoscopy                            12/2014Jacobs The terminal ileum was normal. The  right colon was mildly to moderately inflamed.                            Multiple biopsies were randomly taken and sent to                            pathology. The left colon mucosa was normal                            appearing, random biopsies were also taken and sent                            to pathology. Biopsies from right and left colon                            showed "mildly active, chronic colitis;"  lialda was                            doubled to 4.8 grams dailyColonoscopy 10/2016 was                            normal.Mulitple biopsies taken, all normal on                            path. Medicines:                Monitored Anesthesia Care Procedure:                Pre-Anesthesia Assessment:                           - Prior to the procedure, a History and Physical                            was performed, and patient medications and                            allergies were reviewed. The patient's tolerance of                            previous anesthesia was also reviewed. The risks                            and benefits of the procedure and the sedation                            options and risks were discussed with the patient.                            All questions were answered, and informed consent                            was obtained. Prior Anticoagulants: The patient has  taken no previous anticoagulant or antiplatelet                            agents. ASA Grade Assessment: II - A patient with                            mild systemic disease. After reviewing the risks                            and benefits, the patient was deemed in                            satisfactory condition to undergo the procedure.                           After obtaining informed consent, the colonoscope                            was passed under direct vision. Throughout the                            procedure, the patient's blood pressure, pulse, and                            oxygen saturations were monitored continuously. The                            Colonoscope was introduced through the anus and                            advanced to the the cecum, identified by                            appendiceal orifice and ileocecal valve. The                            colonoscopy was performed without difficulty. The                            patient  tolerated the procedure well. The quality                            of the bowel preparation was good. The ileocecal                            valve, appendiceal orifice, and rectum were                            photographed. Scope In: 7:57:32 AM Scope Out: 8:11:51 AM Scope Withdrawal Time: 0 hours 7 minutes 40 seconds  Total Procedure Duration: 0 hours 14 minutes 19 seconds  Findings:                 The right colon shows evidence of previous colitis                            (  mucosal scarring) without obvious inflammation.                            The left colon mucosa was normal.                           Extensive biopsies were taken from the right and                            left colon in two jars.                           External hemorrhoids were found. The hemorrhoids                            were small.                           The exam was otherwise without abnormality on                            direct and retroflexion views. Complications:            No immediate complications. Estimated blood loss:                            None. Estimated Blood Loss:     Estimated blood loss: none. Impression:               - The right colon shows evidence of previous                            colitis (mucosal scarring) without obvious                            inflammation. The left colon mucosa was normal.                           - Extensive biopsies were taken from the right and                            left colon in two jars.                           - Hemorrhoids. Recommendation:           - Patient has a contact number available for                            emergencies. The signs and symptoms of potential                            delayed complications were discussed with the                            patient. Return to normal activities tomorrow.  Written discharge instructions were provided to the                             patient.                           - Resume previous diet.                           - Continue present medications.                           - Await pathology results. Milus Banister, MD 11/13/2019 8:16:28 AM This report has been signed electronically.

## 2019-11-13 NOTE — Progress Notes (Signed)
Called to room to assist during endoscopic procedure.  Patient ID and intended procedure confirmed with present staff. Received instructions for my participation in the procedure from the performing physician.  

## 2019-11-13 NOTE — Progress Notes (Signed)
Temp by Ledora Bottcher by DT  Pt's states no medical or surgical changes since previsit or office visit.

## 2019-11-13 NOTE — Progress Notes (Signed)
Report given to PACU, vss 

## 2019-11-13 NOTE — Patient Instructions (Signed)
YOU HAD AN ENDOSCOPIC PROCEDURE TODAY AT [facility]:   Refer to the procedure report that was given to you for any specific questions about what was found during the examination.  If the procedure report does not answer your questions, please call your gastroenterologist to clarify.  If you requested that your care partner not be given the details of your procedure findings, then the procedure report has been included in a sealed envelope for you to review at your convenience later.  YOU SHOULD EXPECT: Some feelings of bloating in the abdomen. Passage of more gas than usual.  Walking can help get rid of the air that was put into your GI tract during the procedure and reduce the bloating. If you had a lower endoscopy (such as a colonoscopy or flexible sigmoidoscopy) you may notice spotting of blood in your stool or on the toilet paper. If you underwent a bowel prep for your procedure, you may not have a normal bowel movement for a few days.  Please Note:  You might notice some irritation and congestion in your nose or some drainage.  This is from the oxygen used during your procedure.  There is no need for concern and it should clear up in a day or so.  SYMPTOMS TO REPORT IMMEDIATELY:   Following lower endoscopy (colonoscopy or flexible sigmoidoscopy):  Excessive amounts of blood in the stool  Significant tenderness or worsening of abdominal pains  Swelling of the abdomen that is new, acute  Fever of 100F or higher  For urgent or emergent issues, a gastroenterologist can be reached at any hour by calling [phone].   DIET:  We do recommend a small meal at first, but then you may proceed to your regular diet.  Drink plenty of fluids but you should avoid alcoholic beverages for 24 hours.  ACTIVITY:  You should plan to take it easy for the rest of today and you should NOT DRIVE or use heavy machinery until tomorrow (because of the sedation medicines used during the test).     FOLLOW UP: Our staff will call the number listed on your records 48-72 hours following your procedure to check on you and address any questions or concerns that you may have regarding the information given to you following your procedure. If we do not reach you, we will leave a message.  We will attempt to reach you two times.  During this call, we will ask if you have developed any symptoms of COVID 19. If you develop any symptoms (ie: fever, flu-like symptoms, shortness of breath, cough etc.) before then, please call [phone].  If you test positive for Covid 19 in the 2 weeks post procedure, please call and report this information to [place].    If any biopsies were taken you will be contacted by phone or by letter within the next 1-3 weeks.  Please call us at [phone] if you have not heard about the biopsies in 3 weeks.    SIGNATURES/CONFIDENTIALITY: You and/or your care partner have signed paperwork which will be entered into your electronic medical record.  These signatures attest to the fact that that the information above on your After Visit Summary has been reviewed and is understood.  Full responsibility of the confidentiality of this discharge information lies with you and/or your care-partner.

## 2019-11-15 ENCOUNTER — Telehealth: Payer: Self-pay | Admitting: *Deleted

## 2019-11-15 NOTE — Telephone Encounter (Signed)
  Follow up Call-  Call back number 11/13/2019  Post procedure Call Back phone  # [phone]  Permission to leave phone message Yes  Some recent data might be hidden    Spoke with husband Patient questions:  Do you have a fever, pain , or abdominal swelling? No. Pain Score  0 *  Have you tolerated food without any problems? Yes.    Have you been able to return to your normal activities? Yes.    Do you have any questions about your discharge instructions: Diet   No. Medications  No. Follow up visit  No.  Do you have questions or concerns about your Care? No.  Actions: * If pain score is 4 or above: No action needed, pain <4.  1. Have you developed a fever since your procedure? no  2.   Have you had an respiratory symptoms (SOB or cough) since your procedure? no  3.   Have you tested positive for COVID 19 since your procedure no  4.   Have you had any family members/close contacts diagnosed with the COVID 19 since your procedure?  no   If yes to any of these questions please route to Joylene John, RN and Alphonsa Gin, Therapist, sports.

## 2020-01-26 ENCOUNTER — Other Ambulatory Visit: Payer: Self-pay | Admitting: Gastroenterology

## 2020-04-15 ENCOUNTER — Other Ambulatory Visit: Payer: Self-pay | Admitting: Obstetrics and Gynecology

## 2020-04-15 DIAGNOSIS — Z1231 Encounter for screening mammogram for malignant neoplasm of breast: Secondary | ICD-10-CM

## 2020-05-29 ENCOUNTER — Ambulatory Visit
Admission: RE | Admit: 2020-05-29 | Discharge: 2020-05-29 | Disposition: A | Discharge status: Home / Self Care | Payer: Medicare HMO | Source: Ambulatory Visit | Attending: Obstetrics and Gynecology | Admitting: Obstetrics and Gynecology

## 2020-05-29 ENCOUNTER — Other Ambulatory Visit: Payer: Self-pay

## 2020-05-29 DIAGNOSIS — Z1231 Encounter for screening mammogram for malignant neoplasm of breast: Secondary | ICD-10-CM

## 2020-10-16 ENCOUNTER — Other Ambulatory Visit: Payer: Self-pay | Admitting: Gastroenterology

## 2020-11-14 ENCOUNTER — Other Ambulatory Visit: Payer: Self-pay | Admitting: Gastroenterology

## 2020-12-14 ENCOUNTER — Other Ambulatory Visit: Payer: Self-pay | Admitting: Gastroenterology

## 2021-01-14 ENCOUNTER — Other Ambulatory Visit: Payer: Self-pay | Admitting: Gastroenterology

## 2021-02-09 ENCOUNTER — Other Ambulatory Visit: Payer: Self-pay | Admitting: Gastroenterology

## 2021-04-14 ENCOUNTER — Other Ambulatory Visit: Payer: Self-pay | Admitting: Gastroenterology

## 2021-04-16 ENCOUNTER — Other Ambulatory Visit: Payer: Self-pay | Admitting: Obstetrics and Gynecology

## 2021-04-16 DIAGNOSIS — Z1231 Encounter for screening mammogram for malignant neoplasm of breast: Secondary | ICD-10-CM

## 2021-05-18 ENCOUNTER — Other Ambulatory Visit: Payer: Self-pay | Admitting: Gastroenterology

## 2021-06-13 ENCOUNTER — Ambulatory Visit
Admission: RE | Admit: 2021-06-13 | Discharge: 2021-06-13 | Disposition: A | Discharge status: Home / Self Care | Payer: Medicare HMO | Source: Ambulatory Visit | Attending: Obstetrics and Gynecology | Admitting: Obstetrics and Gynecology

## 2021-06-13 ENCOUNTER — Other Ambulatory Visit: Payer: Self-pay

## 2021-06-13 DIAGNOSIS — Z1231 Encounter for screening mammogram for malignant neoplasm of breast: Secondary | ICD-10-CM

## 2021-06-16 ENCOUNTER — Other Ambulatory Visit: Payer: Self-pay | Admitting: Gastroenterology

## 2021-11-13 ENCOUNTER — Other Ambulatory Visit: Payer: Self-pay | Admitting: Registered Nurse

## 2021-11-13 DIAGNOSIS — E7800 Pure hypercholesterolemia, unspecified: Secondary | ICD-10-CM

## 2021-12-12 ENCOUNTER — Other Ambulatory Visit: Payer: Self-pay | Admitting: Gastroenterology

## 2021-12-16 ENCOUNTER — Ambulatory Visit
Admission: RE | Admit: 2021-12-16 | Discharge: 2021-12-16 | Disposition: A | Discharge status: Home / Self Care | Payer: No Typology Code available for payment source | Source: Ambulatory Visit | Attending: Registered Nurse | Admitting: Registered Nurse

## 2021-12-16 DIAGNOSIS — E7800 Pure hypercholesterolemia, unspecified: Secondary | ICD-10-CM

## 2022-02-12 ENCOUNTER — Other Ambulatory Visit: Payer: Self-pay | Admitting: Gastroenterology

## 2022-02-13 ENCOUNTER — Other Ambulatory Visit: Payer: Self-pay | Admitting: Registered Nurse

## 2022-02-13 DIAGNOSIS — K769 Liver disease, unspecified: Secondary | ICD-10-CM

## 2022-02-18 ENCOUNTER — Ambulatory Visit
Admission: RE | Admit: 2022-02-18 | Discharge: 2022-02-18 | Disposition: A | Discharge status: Home / Self Care | Payer: Medicare HMO | Source: Ambulatory Visit | Attending: Registered Nurse | Admitting: Registered Nurse

## 2022-02-18 ENCOUNTER — Other Ambulatory Visit: Payer: Self-pay

## 2022-02-18 DIAGNOSIS — K769 Liver disease, unspecified: Secondary | ICD-10-CM

## 2022-02-20 ENCOUNTER — Other Ambulatory Visit: Payer: Self-pay | Admitting: Registered Nurse

## 2022-02-20 DIAGNOSIS — K769 Liver disease, unspecified: Secondary | ICD-10-CM

## 2022-03-03 ENCOUNTER — Encounter: Payer: Self-pay | Admitting: Cardiology

## 2022-03-03 ENCOUNTER — Ambulatory Visit: Payer: Medicare HMO | Admitting: Cardiology

## 2022-03-03 VITALS — BP 121/70 | HR 62 | Temp 98.0°F | Resp 16 | Ht 63.0 in | Wt 136.0 lb

## 2022-03-03 DIAGNOSIS — I2510 Atherosclerotic heart disease of native coronary artery without angina pectoris: Secondary | ICD-10-CM

## 2022-03-03 DIAGNOSIS — E7800 Pure hypercholesterolemia, unspecified: Secondary | ICD-10-CM

## 2022-03-03 MED ORDER — METOPROLOL TARTRATE 25 MG PO TABS: 25.0000 mg | ORAL_TABLET | Freq: Two times a day (BID) | ORAL | 0 refills | Status: DC

## 2022-03-03 MED ORDER — ASPIRIN EC 81 MG PO TBEC: 81.0000 mg | DELAYED_RELEASE_TABLET | Freq: Every day | ORAL | 0 refills | Status: AC

## 2022-03-03 NOTE — Patient Instructions (Signed)
Start metoprolol tartrate 3 days before your coronary CTA.  1 tablet twice daily. ? ?Labs a week before your CT scan at at any [facility]. ? ?Start aspirin 81 mg p.o. daily. ? ?Increase rosuvastatin to 20 mg p.o. nightly.  ? ?Echocardiogram prior to your next office visit, performed at the office. ? ?Call if any questions or concerns arise. ?

## 2022-03-03 NOTE — Progress Notes (Signed)
? ?ID:  Autumn Knight, DOB 1951-10-20, MRN [id number] ? ?PCP:  Deland Pretty, MD  ?Cardiologist:  Rex Kras, DO, [facility] (established care 03/03/2022) ? ?REASON FOR CONSULT: Coronary artery calcification ? ?REQUESTING PHYSICIAN:  ?Deland Pretty, MD ?[address],  [place] [address] ? ?Chief Complaint  ?Patient presents with  ? Hyperlipidemia  ? New Patient (Initial Visit)  ?  CT score  ? ? ?HPI  ?Autumn Knight is a 71 y.o. Caucasian female whose past medical history and cardiovascular risk factors include: Moderate coronary calcification, hyperlipidemia, ulcerative colitis, endometrial cancer status post surgery and radiation, former smoker.  ? ?She is referred to the office at the request of Deland Pretty, MD for evaluation of coronary artery calcification. ? ?Patient has a history of hyperlipidemia and has been on statin therapy for the last several years.  She underwent a coronary artery calcium score for screening purposes under the care of her PCP.  She was noted to have moderate coronary artery calcification predominantly in the LAD/left main distribution and therefore referred to cardiology for further evaluation and management. ? ?Clinically she denies any chest pain, shortness of breath at rest or with effort related activities, orthopnea, paroxysmal nocturnal dyspnea or lower extremity swelling.  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  She has not noticed any change in physical endurance. ? ?No family history of premature coronary artery disease, sudden cardiac death, or cardiomyopathy. ? ?FUNCTIONAL STATUS: ?[place] couple days a week and works as a Advertising account executive.   ? ?ALLERGIES: ?No Known Allergies ? ?MEDICATION LIST PRIOR TO VISIT: ?Current Meds  ?Medication Sig  ? aspirin EC 81 MG tablet Take 1 tablet (81 mg total) by mouth daily. Swallow whole.  ? calcium carbonate (OS-CAL) 600 MG TABS Take 600  mg by mouth daily.  ? cholecalciferol (VITAMIN D) 1000 UNITS tablet Take 1,000 Units by mouth daily.  ? Lactobacillus (ACIDOPHILUS PO) Take by mouth. Probiotic  ? mesalamine (LIALDA) 1.2 g EC tablet TAKE 4 TABLETS(4.8 GRAMS) BY MOUTH DAILY  ? metoprolol tartrate (LOPRESSOR) 25 MG tablet Take 1 tablet (25 mg total) by mouth 2 (two) times daily for 7 days.  ? Multiple Vitamin (MULTIVITAMIN) tablet Take 1 tablet by mouth daily.  ? OVER THE COUNTER MEDICATION Biotin 10,000 mcg. One tablet daily.  ? Psyllium (FIBER) 0.52 G CAPS Take 2 capsules by mouth daily.  ? rosuvastatin (CRESTOR) 10 MG tablet Take 20 mg by mouth at bedtime.  ? TURMERIC PO Take by mouth.  ? zinc gluconate 50 MG tablet Take by mouth.  ?  ? ?PAST MEDICAL HISTORY: ?Past Medical History:  ?Diagnosis Date  ? Endometrial cancer ([place])   ? History of colon polyps   ? Hyperlipidemia   ? Osteopenia   ? Other and unspecified noninfectious gastroenteritis and colitis(558.9)   ? Ulcerative colitis ([place])   ? ? ?PAST SURGICAL HISTORY: ?Past Surgical History:  ?Procedure Laterality Date  ? ABDOMINAL HYSTERECTOMY    ? BREAST EXCISIONAL BIOPSY Left 1980's  ? ? ?FAMILY HISTORY: ?The patient family history includes Breast cancer (age of onset: 49) in her maternal grandmother; Cancer in her mother. ? ?SOCIAL HISTORY:  ?The patient  reports that she quit smoking about 45 years ago. She has never used smokeless tobacco. She reports current alcohol use. She reports that she does not use drugs. ? ?REVIEW OF SYSTEMS: ?Review of Systems  ?  Cardiovascular:  Negative for chest pain, claudication, dyspnea on exertion, leg swelling, near-syncope, orthopnea, palpitations, paroxysmal nocturnal dyspnea and syncope.  ? ?PHYSICAL EXAM: ? ?  03/03/2022  ?  1:04 PM 11/13/2019  ?  8:35 AM 11/13/2019  ?  8:25 AM  ?Vitals with BMI  ?Height 5' 3"     ?Weight 136 lbs    ?BMI 24.1    ?Systolic 428 768 115  ?Diastolic 70 67 76  ?Pulse 62 62 64  ? ? ?CONSTITUTIONAL: Well-developed and  well-nourished. No acute distress.  ?SKIN: Skin is warm and dry. No rash noted. No cyanosis. No pallor. No jaundice ?HEAD: Normocephalic and atraumatic.  ?EYES: No scleral icterus ?MOUTH/THROAT: Moist oral membranes.  ?NECK: No JVD present. No thyromegaly noted. No carotid bruits  ?LYMPHATIC: No visible cervical adenopathy.  ?CHEST Normal respiratory effort. No intercostal retractions  ?LUNGS: Clear to auscultation bilaterally.  No stridor. No wheezes. No rales.  ?CARDIOVASCULAR: Regular rate and rhythm, positive S1-S2, no murmurs rubs or gallops appreciated. ?ABDOMINAL: Soft, nontender, nondistended, positive bowel sounds in all 4 quadrants, no apparent ascites.  ?EXTREMITIES: No peripheral edema, warm to touch, 2+ bilateral DP and PT pulses ?HEMATOLOGIC: No significant bruising ?NEUROLOGIC: Oriented to person, place, and time. Nonfocal. Normal muscle tone.  ?PSYCHIATRIC: Normal mood and affect. Normal behavior. Cooperative ? ?CARDIAC DATABASE: ?EKG: ?03/03/2022: Sinus  Bradycardia, 57bpm, TWI V1-V2, without underlying injury pattern. ? ?Echocardiogram: ?No results found for this or any previous visit from the past 1095 days. ?  ? ?Stress Testing: ?No results found for this or any previous visit from the past 1095 days. ? ? ?Heart Catheterization: ?None ? ?CT Cardiac Scoring: ?12/12/2021 ?Left main 63.6. ?LAD 93.9 ?LCx 0. ?RCA 0 ?Ascending aorta 32 mm. ?Descending aorta 20 mm ?Total CAC 158 AU, 80th percentile for patient's age, sex, and race. ? ?LABORATORY DATA: ?Lipid Panel   2021-11-10    ?Cholesterol 207   <200  ?Cholesterol / HDL Ratio 2.76   0.00-4.44  ?HDL Cholesterol 75   >39  ?LDL Cholesterol (Calculation) 102   <130  ?LDL/HDL Ratio 1.4   <3.3  ?Non-HDL Cholesterol 132   <130  ?Triglycerides 148   <150  ?TSH   2021-11-10    ?TSH 1.22   0.27-4.20  ?Vitamin D 25-Hydroxy   2021-11-10    ?Vitamin D 25-Hydroxy 33.1   30.0-100.0  ?CBC With Platelet And Differential RS In-house   2021-11-10    ?Absolute Basophils  0.0   0.0-0.1  ?Absolute Eosinophils 0.1   0.0-0.6  ?Absolute Lymphocytes 1.2   0.9-3.6  ?Absolute Monocytes 0.4   0.3-1.0  ?Absolute Neutrophils 2.6   2.0-8.2  ?Basophils Automated 0.5   0.0-2.0  ?Eosinophils Automated 3.2   0.0-7.0  ?Hematocrit 37.7   37.0-47.0  ?Hemoglobin 12.2   11.5-15.0  ?Lymphocytes Automated 28.1   20.5-51.1  ?MCH 31.1   26.0-33.0  ?MCHC 32.4   32.0-36.0  ?MCV 96.2   82.0-101.0  ?Monocytes Automated 8.4   5.0-12.0  ?Neutrophils Automated 59.8   42.2-75.2  ?Platelet Count 210   140-400  ?RBC 3.92   4.20-5.40  ?RDW 42.1      ?WBC 4.4   4.5-11.0  ?Comprehensive Metabolic Panel RS In-house   2021-11-10    ?Albumin 3.7   3.4-5.0  ?Albumin/Globulin Ratio 1.2   1.1-2.5  ?Alkaline Phosphatase 47   25-150  ?ALT (SGPT) 33   <6-78  ?AST (SGOT) 27   0-40  ?Bilirubin, Total 0.5   0.2-1.0  ?BUN 14  7-18  ?BUN/Creatinine Ratio 18.2   11.0-26.0  ?Calcium 8.9   8.5-10.1  ?Chloride 105   98-107  ?CO2 25   21-32  ?Creatinine 0.77   0.55-1.02  ?GFR/Black 91   >59  ?GFR/White 79   >59  ?Globulin, Calculated 3.1   1.5-4.6  ?Glucose 94   74-106  ?Potassium 4.1   3.5-5.1  ?Protein 6.8   6.4-8.2  ?Sodium 139   136-145  ? ? ?IMPRESSION: ? ?  ICD-10-CM   ?1. Coronary atherosclerosis due to calcified coronary lesion of native artery  I25.10 EKG 12-Lead  ? I25.84 aspirin EC 81 MG tablet  ?  metoprolol tartrate (LOPRESSOR) 25 MG tablet  ?  Basic metabolic panel  ?  PCV ECHOCARDIOGRAM COMPLETE  ?  CT CORONARY MORPH W/CTA COR W/SCORE W/CA W/CM &/OR WO/CM  ?  ?2. Pure hypercholesterolemia  E78.00   ?  ?  ? ?RECOMMENDATIONS: ?Autumn Knight is a 71 y.o. Caucasian female whose past medical history and cardiac risk factors include: Moderate coronary calcification, hyperlipidemia, ulcerative colitis, endometrial cancer status post surgery and radiation, former smoker.  ? ?Coronary atherosclerosis due to calcified coronary lesion of native artery ?Denies angina pectoris.  ?Total CAC 158, 80th percentile ?Start aspirin 81 mg p.o.  daily. ?Increase rosuvastatin to 20 mg p.o. nightly.  ?Patient is asked to increase physical activity as tolerated with a goal of 30 minutes a day 5 days a week. ?Her total coronary calcium score is within the lef

## 2022-03-10 ENCOUNTER — Ambulatory Visit: Payer: Medicare HMO

## 2022-03-10 DIAGNOSIS — I2510 Atherosclerotic heart disease of native coronary artery without angina pectoris: Secondary | ICD-10-CM

## 2022-03-11 ENCOUNTER — Other Ambulatory Visit: Payer: Self-pay | Admitting: Gastroenterology

## 2022-03-12 LAB — BASIC METABOLIC PANEL
BUN/Creatinine Ratio: 18 (ref 12–28)
BUN: 16 mg/dL (ref 8–27)
CO2: 24 mmol/L (ref 20–29)
Calcium: 9.8 mg/dL (ref 8.7–10.3)
Chloride: 105 mmol/L (ref 96–106)
Creatinine, Ser: 0.89 mg/dL (ref 0.57–1.00)
Glucose: 118 mg/dL — ABNORMAL HIGH (ref 70–99)
Potassium: 4.4 mmol/L (ref 3.5–5.2)
Sodium: 142 mmol/L (ref 134–144)
eGFR: 70 mL/min/{1.73_m2} (ref >59)

## 2022-03-17 ENCOUNTER — Ambulatory Visit
Admission: RE | Admit: 2022-03-17 | Discharge: 2022-03-17 | Disposition: A | Discharge status: Home / Self Care | Payer: Medicare HMO | Source: Ambulatory Visit | Attending: Registered Nurse | Admitting: Registered Nurse

## 2022-03-17 DIAGNOSIS — K769 Liver disease, unspecified: Secondary | ICD-10-CM

## 2022-03-17 MED ORDER — GADOBENATE DIMEGLUMINE 529 MG/ML IV SOLN
12.0000 mL | Freq: Once | INTRAVENOUS | Status: AC | PRN
  Administered 2022-03-17: 12 mL via INTRAVENOUS

## 2022-03-23 ENCOUNTER — Telehealth (HOSPITAL_COMMUNITY): Payer: Self-pay | Admitting: *Deleted

## 2022-03-23 NOTE — Telephone Encounter (Signed)
Reaching out to patient to offer assistance regarding upcoming cardiac imaging study; pt verbalizes understanding of appt date/time, parking situation and where to check in, pre-test NPO status and medications ordered, and verified current allergies; name and call back number provided for further questions should they arise ? ?Gordy Clement RN Navigator Cardiac Imaging ? Heart and Vascular ?[phone] office ?[phone] cell ? ?Patient to take 77m metoprolol tartrate BID. She is aware to arrive at 7:15am. ?

## 2022-03-23 NOTE — Telephone Encounter (Signed)
Attempted to call patient regarding upcoming cardiac CT appointment. °Left message on voicemail with name and callback number ° °Anecia Nusbaum RN Navigator Cardiac Imaging °[facility] °[phone] Office °[phone] Cell ° °

## 2022-03-24 ENCOUNTER — Encounter (HOSPITAL_COMMUNITY): Payer: Self-pay

## 2022-03-24 ENCOUNTER — Ambulatory Visit (HOSPITAL_COMMUNITY)
Admission: RE | Admit: 2022-03-24 | Discharge: 2022-03-24 | Disposition: A | Discharge status: Home / Self Care | Payer: Medicare HMO | Source: Ambulatory Visit | Attending: Internal Medicine | Admitting: Internal Medicine

## 2022-03-24 DIAGNOSIS — I2510 Atherosclerotic heart disease of native coronary artery without angina pectoris: Secondary | ICD-10-CM | POA: Insufficient documentation

## 2022-03-24 DIAGNOSIS — I2584 Coronary atherosclerosis due to calcified coronary lesion: Secondary | ICD-10-CM | POA: Diagnosis present

## 2022-03-24 MED ORDER — IOHEXOL 350 MG/ML SOLN
100.0000 mL | Freq: Once | INTRAVENOUS | Status: AC | PRN
  Administered 2022-03-24: 100 mL via INTRAVENOUS

## 2022-03-24 MED ORDER — NITROGLYCERIN 0.4 MG SL SUBL
SUBLINGUAL_TABLET | SUBLINGUAL | Status: AC
  Filled 2022-03-24: qty 2

## 2022-03-24 MED ORDER — NITROGLYCERIN 0.4 MG SL SUBL
0.8000 mg | SUBLINGUAL_TABLET | Freq: Once | SUBLINGUAL | Status: AC
Start: 2022-03-24 — End: 2022-03-24
  Administered 2022-03-24: 0.8 mg via SUBLINGUAL

## 2022-04-02 ENCOUNTER — Ambulatory Visit: Payer: Medicare HMO | Admitting: Cardiology

## 2022-04-09 ENCOUNTER — Ambulatory Visit: Payer: Medicare HMO | Admitting: Cardiology

## 2022-04-09 ENCOUNTER — Encounter: Payer: Self-pay | Admitting: Cardiology

## 2022-04-09 VITALS — BP 129/75 | HR 74 | Temp 98.0°F | Resp 17 | Ht 62.0 in | Wt 138.0 lb

## 2022-04-09 DIAGNOSIS — I2510 Atherosclerotic heart disease of native coronary artery without angina pectoris: Secondary | ICD-10-CM

## 2022-04-09 DIAGNOSIS — E7800 Pure hypercholesterolemia, unspecified: Secondary | ICD-10-CM

## 2022-04-09 MED ORDER — ROSUVASTATIN CALCIUM 20 MG PO TABS: 20.0000 mg | ORAL_TABLET | Freq: Every day | ORAL | 0 refills | Status: DC

## 2022-04-09 NOTE — Progress Notes (Signed)
ID:  Autumn Knight, DOB [birth year], MRN [id number]  PCP:  Deland Pretty, MD  Cardiologist:  Rex Kras, DO, [facility] (established care 03/03/2022)  Date: 04/09/22 Last Office Visit: 03/03/2022  Chief Complaint  Patient presents with   Coronary Artery Calcification   Follow-up    4 weeks    HPI  Autumn Knight is a 71 y.o. Caucasian female whose past medical history and cardiovascular risk factors include: Moderate coronary calcification, hyperlipidemia, ulcerative colitis, endometrial cancer status post surgery and radiation, former smoker.   Referred to the practice for management of coronary artery calcification.  She has known history of hyperlipidemia and has been on statin therapy.  However, she underwent coronary calcium score with her PCP and was noted to have CAC in the LAD/LCx distribution (predominant).  At the last office visit the shared decision was to proceed with coronary CTA for further evaluation and management.  Results of the coronary CTA reviewed with the patient and noted below for further reference.  Given the findings of the study would recommend CT FFR to evaluate for hemodynamic significance.  However due to the degree of artifact the study did not meet the quality control for submission to CT FFR Heartflow.  Clinically she denies anginal discomfort.  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  She has not noticed any significant change in physical endurance.  No family history of premature coronary artery disease, sudden cardiac death, or cardiomyopathy.  FUNCTIONAL STATUS: [place]umb[place] couple days a week and works as a Advertising account executive.    ALLERGIES: No Known Allergies  MEDICATION LIST PRIOR TO VISIT: Current Meds  Medication Sig   aspirin EC 81 MG tablet Take 1 tablet (81 mg total) by mouth daily. Swallow whole.   calcium carbonate (OS-CAL) 600 MG TABS Take 600 mg by mouth daily.    cholecalciferol (VITAMIN D) 1000 UNITS tablet Take 1,000 Units by mouth daily.   Lactobacillus (ACIDOPHILUS PO) Take by mouth. Probiotic   Multiple Vitamin (MULTIVITAMIN) tablet Take 1 tablet by mouth daily.   OVER THE COUNTER MEDICATION Biotin 10,000 mcg. One tablet daily.   Psyllium (FIBER) 0.52 G CAPS Take 2 capsules by mouth daily.   rosuvastatin (CRESTOR) 20 MG tablet Take 1 tablet (20 mg total) by mouth at bedtime.   TURMERIC PO Take by mouth.   zinc gluconate 50 MG tablet Take by mouth.   [DISCONTINUED] mesalamine (LIALDA) 1.2 g EC tablet TAKE 4 TABLETS(4.8 GRAMS) BY MOUTH DAILY   [DISCONTINUED] rosuvastatin (CRESTOR) 10 MG tablet Take 20 mg by mouth at bedtime.     PAST MEDICAL HISTORY: Past Medical History:  Diagnosis Date   Endometrial cancer ([place])    History of colon polyps    Hyperlipidemia    Osteopenia    Other and unspecified noninfectious gastroenteritis and colitis(558.9)    Ulcerative colitis ([place])     PAST SURGICAL HISTORY: Past Surgical History:  Procedure Laterality Date   ABDOMINAL HYSTERECTOMY     BREAST EXCISIONAL BIOPSY Left 1980's    FAMILY HISTORY: The patient family history includes Breast cancer (age of onset: 59) in her maternal grandmother; Cancer in her mother.  SOCIAL HISTORY:  The patient  reports that she quit smoking about 45 years ago. Her smoking use included cigarettes. She has never used smokeless tobacco. She reports current alcohol use. She reports that she does not use drugs.  REVIEW OF SYSTEMS:  Review of Systems  Cardiovascular:  Negative for chest pain, claudication, dyspnea on exertion, leg swelling, near-syncope, orthopnea, palpitations, paroxysmal nocturnal dyspnea and syncope.   PHYSICAL EXAM:    04/09/2022   10:07 AM 03/24/2022    8:10 AM 03/24/2022    7:43 AM  Vitals with BMI  Height 5' 2"     Weight 138 lbs    BMI 22.02    Systolic 542 706 237  Diastolic 75 60 78  Pulse 74 61 59    CONSTITUTIONAL: Well-developed and  well-nourished. No acute distress.  SKIN: Skin is warm and dry. No rash noted. No cyanosis. No pallor. No jaundice HEAD: Normocephalic and atraumatic.  EYES: No scleral icterus MOUTH/THROAT: Moist oral membranes.  NECK: No JVD present. No thyromegaly noted. No carotid bruits  LYMPHATIC: No visible cervical adenopathy.  CHEST Normal respiratory effort. No intercostal retractions  LUNGS: Clear to auscultation bilaterally.  No stridor. No wheezes. No rales.  CARDIOVASCULAR: Regular rate and rhythm, positive S1-S2, no murmurs rubs or gallops appreciated. ABDOMINAL: Soft, nontender, nondistended, positive bowel sounds in all 4 quadrants, no apparent ascites.  EXTREMITIES: No peripheral edema, warm to touch, 2+ bilateral DP and PT pulses HEMATOLOGIC: No significant bruising NEUROLOGIC: Oriented to person, place, and time. Nonfocal. Normal muscle tone.  PSYCHIATRIC: Normal mood and affect. Normal behavior. Cooperative  CARDIAC DATABASE: EKG: 03/03/2022: Sinus  Bradycardia, 57bpm, TWI V1-V2, without underlying injury pattern.  Echocardiogram: 03/10/2022: Normal LV systolic function with visual EF 60-65%. Left ventricle cavity is normal in size. Normal left ventricular wall thickness. Normal global wall motion. Normal diastolic filling pattern, normal LAP.  Mild (Grade I) mitral regurgitation. No prior study for comparison.   Stress Testing: No results found for this or any previous visit from the past 1095 days.   Heart Catheterization: None  CCTA: 03/24/2022 1. Total coronary calcium score of 151. This was 80th percentile for age and sex matched control. 2. Normal coronary origin with right dominance. 3. CAD-RADS = 3. Left Main: Moderate stenosis (50-69%) closer to 50% due to eccentric calcified plaque at the distal left main that extends into the ostial LAD. LAD: Moderate stenosis (50-69%) visually closer to 50% due to eccentric calcified plaque at the ostial LAD (as discussed  above). Moderate stenosis (50-69%) due to eccentric mixed plaque at proximal/mid LAD at the takeoff of the first diagonal branch. Mid to distal LAD is patent. First diagonal is large size, normal caliber, gives off superior and inferior branches and is overall patent appears to be LAD equivalent. Ramus: Patent. LCx: Patent. RCA: Patent. 4.  Aortic atherosclerosis. 5. Due to the degree of artifact present the study did not meet quality control standards for it to be sent for CT-FFR for further analysis.  LABORATORY DATA: Lipid Panel   2021-11-10    Cholesterol 207   <200  Cholesterol / HDL Ratio 2.76   0.00-4.44  HDL Cholesterol 75   >39  LDL Cholesterol (Calculation) 102   <130  LDL/HDL Ratio 1.4   <3.3  Non-HDL Cholesterol 132   <130  Triglycerides 148   <150  TSH   2021-11-10    TSH 1.22   0.27-4.20  Vitamin D 25-Hydroxy   2021-11-10    Vitamin D 25-Hydroxy 33.1   30.0-100.0  CBC With Platelet And Differential RS In-house   2021-11-10    Absolute Basophils 0.0   0.0-0.1  Absolute Eosinophils 0.1   0.0-0.6  Absolute Lymphocytes 1.2   0.9-3.6  Absolute Monocytes 0.4   0.3-1.0  Absolute Neutrophils 2.6   2.0-8.2  Basophils Automated 0.5   0.0-2.0  Eosinophils Automated 3.2   0.0-7.0  Hematocrit 37.7   37.0-47.0  Hemoglobin 12.2   11.5-15.0  Lymphocytes Automated 28.1   20.5-51.1  MCH 31.1   26.0-33.0  MCHC 32.4   32.0-36.0  MCV 96.2   82.0-101.0  Monocytes Automated 8.4   5.0-12.0  Neutrophils Automated 59.8   42.2-75.2  Platelet Count 210   140-400  RBC 3.92   4.20-5.40  RDW 42.1      WBC 4.4   4.5-11.0  Comprehensive Metabolic Panel RS In-house   2021-11-10    Albumin 3.7   3.4-5.0  Albumin/Globulin Ratio 1.2   1.1-2.5  Alkaline Phosphatase 47   25-150  ALT (SGPT) 33   <6-78  AST (SGOT) 27   0-40  Bilirubin, Total 0.5   0.2-1.0  BUN 14   7-18  BUN/Creatinine Ratio 18.2   11.0-26.0  Calcium 8.9   8.5-10.1  Chloride 105   98-107  CO2 25   21-32  Creatinine 0.77    0.55-1.02  GFR/Black 91   >59  GFR/White 79   >59  Globulin, Calculated 3.1   1.5-4.6  Glucose 94   74-106  Potassium 4.1   3.5-5.1  Protein 6.8   6.4-8.2  Sodium 139   136-145    IMPRESSION:    ICD-10-CM   1. Coronary atherosclerosis due to calcified coronary lesion of native artery  I25.10 rosuvastatin (CRESTOR) 20 MG tablet   I25.84 PCV MYOCARDIAL PERFUSION WO LEXISCAN    2. Pure hypercholesterolemia  E78.00 rosuvastatin (CRESTOR) 20 MG tablet    Lipid Panel With LDL/HDL Ratio    LDL cholesterol, direct    CMP14+EGFR       RECOMMENDATIONS: Autumn Knight is a 71 y.o. Caucasian female whose past medical history and cardiac risk factors include: Moderate coronary calcification, hyperlipidemia, ulcerative colitis, endometrial cancer status post surgery and radiation, former smoker.   Coronary atherosclerosis due to calcified coronary lesion of native artery Denies angina pectoris. Tolerated the uptitration of Crestor to 20 mg p.o. nightly.  Prescription refilled and follow-up labs ordered. Coronary CTA results including images discussed with the patient in great detail. Patient has significant disease burden in the distal left main and the ostial/proximal LAD distribution.  Due to the presence of artifact and the study did not meet the quality control to be submitted for CT FFR Heartflow. However, given the plaque distribution discussed undergoing exercise stress test to evaluate for reversible ischemia. Patient is able to exercise and EKG is interpretable we will schedule for exercise nuclear stress test. Further recommendations to follow.  Pure hypercholesterolemia LDL levels well controlled as of December 2022 at 102 mg/dL.   Given the distribution of the coronary calcium she was recommended to uptitrate Crestor to 20 mg p.o. nightly.   We will repeat fasting lipid profile and CMP.  As part of today's office visit discussed management of least 2 chronic comorbid conditions,  independently reviewed including images of the coronary CTA with the patient at today's office visit, medications refilled, labs ordered, and additional diagnostic work-up initiated.  Further recommendations to follow.  FINAL MEDICATION LIST END OF ENCOUNTER: Meds ordered this encounter  Medications   rosuvastatin (CRESTOR) 20 MG tablet    Sig: Take 1 tablet (20 mg total) by mouth at bedtime.    Dispense:  90 tablet    Refill:  0    Medications Discontinued During This Encounter  Medication Reason   rosuvastatin (CRESTOR) 10 MG tablet Change in therapy     Current Outpatient Medications:    aspirin EC 81 MG tablet, Take 1 tablet (81 mg total) by mouth daily. Swallow whole., Disp: 90 tablet, Rfl: 0   calcium carbonate (OS-CAL) 600 MG TABS, Take 600 mg by mouth daily., Disp: , Rfl:    cholecalciferol (VITAMIN D) 1000 UNITS tablet, Take 1,000 Units by mouth daily., Disp: , Rfl:    Lactobacillus (ACIDOPHILUS PO), Take by mouth. Probiotic, Disp: , Rfl:    Multiple Vitamin (MULTIVITAMIN) tablet, Take 1 tablet by mouth daily., Disp: , Rfl:    OVER THE COUNTER MEDICATION, Biotin 10,000 mcg. One tablet daily., Disp: , Rfl:    Psyllium (FIBER) 0.52 G CAPS, Take 2 capsules by mouth daily., Disp: , Rfl:    rosuvastatin (CRESTOR) 20 MG tablet, Take 1 tablet (20 mg total) by mouth at bedtime., Disp: 90 tablet, Rfl: 0   TURMERIC PO, Take by mouth., Disp: , Rfl:    zinc gluconate 50 MG tablet, Take by mouth., Disp: , Rfl:    mesalamine (LIALDA) 1.2 g EC tablet, TAKE 4 TABLETS(4.8 GRAMS) BY MOUTH DAILY, Disp: 120 tablet, Rfl: 0   metoprolol tartrate (LOPRESSOR) 25 MG tablet, Take 1 tablet (25 mg total) by mouth 2 (two) times daily for 7 days., Disp: 14 tablet, Rfl: 0  Orders Placed This Encounter  Procedures   Lipid Panel With LDL/HDL Ratio   LDL cholesterol, direct   CMP14+EGFR   PCV MYOCARDIAL PERFUSION WO LEXISCAN    There are no Patient Instructions on file for this visit.   --Continue  cardiac medications as reconciled in final medication list. --Return in about 7 weeks (around 05/28/2022) for Follow up, Coronary artery calcification, stress test. Or sooner if needed. --Continue follow-up with your primary care physician regarding the management of your other chronic comorbid conditions.  Patient's questions and concerns were addressed to her satisfaction. She voices understanding of the instructions provided during this encounter.   This note was created using a voice recognition software as a result there may be grammatical errors inadvertently enclosed that do not reflect the nature of this encounter. Every attempt is made to correct such errors.  Rex Kras, [place], [facility]  Pager: [phone] Office: [phone]

## 2022-04-10 ENCOUNTER — Other Ambulatory Visit: Payer: Self-pay | Admitting: Gastroenterology

## 2022-05-01 ENCOUNTER — Other Ambulatory Visit: Payer: Self-pay | Admitting: Obstetrics and Gynecology

## 2022-05-01 DIAGNOSIS — Z1231 Encounter for screening mammogram for malignant neoplasm of breast: Secondary | ICD-10-CM

## 2022-05-06 ENCOUNTER — Ambulatory Visit: Payer: Medicare HMO

## 2022-05-06 DIAGNOSIS — I2510 Atherosclerotic heart disease of native coronary artery without angina pectoris: Secondary | ICD-10-CM

## 2022-05-12 ENCOUNTER — Other Ambulatory Visit: Payer: Self-pay | Admitting: Gastroenterology

## 2022-05-27 NOTE — Progress Notes (Signed)
External Labs: Collected: 05/18/2022 provided by primary care Total cholesterol 202, triglycerides 199, HDL 73, LDL 89, non-HDL 129 Sodium 139, potassium 4.5, chloride 103, bicarb 26, BUN 15, creatinine 0.85. AST 31, ALT 51, alkaline phosphatase 50

## 2022-06-01 ENCOUNTER — Ambulatory Visit: Payer: Medicare HMO | Admitting: Cardiology

## 2022-06-02 ENCOUNTER — Ambulatory Visit: Payer: Medicare HMO | Admitting: Cardiology

## 2022-06-02 ENCOUNTER — Encounter: Payer: Self-pay | Admitting: Cardiology

## 2022-06-02 VITALS — BP 126/74 | HR 76 | Temp 98.3°F | Resp 17 | Ht 62.0 in | Wt 140.0 lb

## 2022-06-02 DIAGNOSIS — I2510 Atherosclerotic heart disease of native coronary artery without angina pectoris: Secondary | ICD-10-CM

## 2022-06-02 DIAGNOSIS — I700 Atherosclerosis of aorta: Secondary | ICD-10-CM

## 2022-06-02 DIAGNOSIS — R931 Abnormal findings on diagnostic imaging of heart and coronary circulation: Secondary | ICD-10-CM

## 2022-06-02 DIAGNOSIS — E7800 Pure hypercholesterolemia, unspecified: Secondary | ICD-10-CM

## 2022-06-02 NOTE — Progress Notes (Signed)
ID:  Autumn Knight, DOB [birth year], MRN [id number]  PCP:  Deland Pretty, MD  Cardiologist:  Rex Kras, DO, [facility] (established care 03/03/2022)  Date: 06/02/22 Last Office Visit: 04/09/2022  Chief Complaint  Patient presents with   Follow-up   CORONARY ARTERY CALCIFICATION   Results    HPI  Autumn Knight is a 71 y.o. Caucasian female whose past medical history and cardiovascular risk factors include: Moderate coronary calcification, hyperlipidemia, ulcerative colitis, endometrial cancer status post surgery and radiation, former smoker.   Patient was referred to the practice for management of coronary artery calcification.  She is noted to have moderate CAC with a total score of 151 placing her at the 80th percentile.  The coronary calcium distribution was noted to be in the left main/LAD distribution.  She underwent coronary CTA to evaluate for hemodynamic significance.  However, due to the degree of artifact present the study did not meet the quality control for submission to CT FFR Heartflow.  Therefore in the last office visit the shared decision was to proceed with exercise nuclear stress test.  Since last office visit patient has enrolled into a regular fitness center which she is attending quite routinely and also does water aerobics.  She denies any exertional chest pain or heart failure symptoms.   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  She had recent labs from June 2023 which were independently reviewed and noted below for further reference.  LDL levels have improved but currently not at goal.  Triglyceride levels are not well controlled.  We discussed implementing lifestyle changes versus pharmacological therapy and she would like to consider lifestyle changes first.  FUNCTIONAL STATUS: Zumba couple days a week and works as a Advertising account executive.    ALLERGIES: No Known Allergies  MEDICATION LIST PRIOR TO VISIT: Current Meds   Medication Sig   aspirin EC 81 MG tablet Take 81 mg by mouth daily. Swallow whole.   calcium carbonate (OS-CAL) 600 MG TABS Take 600 mg by mouth daily.   cholecalciferol (VITAMIN D) 1000 UNITS tablet Take 1,000 Units by mouth daily.   Lactobacillus (ACIDOPHILUS PO) Take by mouth. Probiotic   mesalamine (LIALDA) 1.2 g EC tablet TAKE 4 TABLETS(4.8 GRAMS) BY MOUTH DAILY   Multiple Vitamin (MULTIVITAMIN) tablet Take 1 tablet by mouth daily.   OVER THE COUNTER MEDICATION Biotin 10,000 mcg. One tablet daily.   Psyllium (FIBER) 0.52 G CAPS Take 2 capsules by mouth daily.   rosuvastatin (CRESTOR) 20 MG tablet Take 1 tablet (20 mg total) by mouth at bedtime.   TURMERIC PO Take by mouth.   zinc gluconate 50 MG tablet Take by mouth.     PAST MEDICAL HISTORY: Past Medical History:  Diagnosis Date   Endometrial cancer ([place])    History of colon polyps    Hyperlipidemia    Osteopenia    Other and unspecified noninfectious gastroenteritis and colitis(558.9)    Ulcerative colitis ([place])     PAST SURGICAL HISTORY: Past Surgical History:  Procedure Laterality Date   ABDOMINAL HYSTERECTOMY     BREAST EXCISIONAL BIOPSY Left 1980's    FAMILY HISTORY: The patient family history includes Breast cancer (age of onset: 45) in her maternal grandmother; Cancer in her mother.  SOCIAL HISTORY:  The patient  reports that she quit smoking about 45 years ago. Her smoking use included cigarettes. She has never used smokeless tobacco. She reports current alcohol use. She reports that  she does not use drugs.  REVIEW OF SYSTEMS: Review of Systems  Cardiovascular:  Negative for chest pain, claudication, dyspnea on exertion, leg swelling, near-syncope, orthopnea, palpitations, paroxysmal nocturnal dyspnea and syncope.    PHYSICAL EXAM:    06/02/2022    9:21 AM 04/09/2022   10:07 AM 03/24/2022    8:10 AM  Vitals with BMI  Height 5' 2"  5' 2"    Weight 140 lbs 138 lbs   BMI 25.3 66.44   Systolic 034 742 595   Diastolic 74 75 60  Pulse 76 74 61    CONSTITUTIONAL: Well-developed and well-nourished. No acute distress.  SKIN: Skin is warm and dry. No rash noted. No cyanosis. No pallor. No jaundice HEAD: Normocephalic and atraumatic.  EYES: No scleral icterus MOUTH/THROAT: Moist oral membranes.  NECK: No JVD present. No thyromegaly noted. No carotid bruits  CHEST Normal respiratory effort. No intercostal retractions  LUNGS: Clear to auscultation bilaterally.  No stridor. No wheezes. No rales.  CARDIOVASCULAR: Regular rate and rhythm, positive S1-S2, no murmurs rubs or gallops appreciated. ABDOMINAL: Soft, nontender, nondistended, positive bowel sounds in all 4 quadrants, no apparent ascites.  EXTREMITIES: No peripheral edema, warm to touch, 2+ bilateral DP and PT pulses HEMATOLOGIC: No significant bruising NEUROLOGIC: Oriented to person, place, and time. Nonfocal. Normal muscle tone.  PSYCHIATRIC: Normal mood and affect. Normal behavior. Cooperative No significant change in physical examination since last office visit.  CARDIAC DATABASE: EKG: 03/03/2022: Sinus  Bradycardia, 57bpm, TWI V1-V2, without underlying injury pattern.  Echocardiogram: 03/10/2022: Normal LV systolic function with visual EF 60-65%. Left ventricle cavity is normal in size. Normal left ventricular wall thickness. Normal global wall motion. Normal diastolic filling pattern, normal LAP.  Mild (Grade I) mitral regurgitation. No prior study for comparison.   Stress Testing: Exercise Myoview stress test 05/06/2022: Exercise nuclear stress test was performed using Bruce protocol.  1 Day Rest and Stress images. Exercise time 6 minutes 1 second, achieved 7.05 METS, 93%APMHR.  Stress ECG negative for ischemia. Normal myocardial perfusion without convincing evidence of reversible myocardial ischemia or prior infarct. Left ventricular size normal, wall motion preserved, calculated LVEF 73%. Low risk study.  Heart  Catheterization: None  CCTA: 03/24/2022 1. Total coronary calcium score of 151. This was 80th percentile for age and sex matched control. 2. Normal coronary origin with right dominance. 3. CAD-RADS = 3. Left Main: Moderate stenosis (50-69%) closer to 50% due to eccentric calcified plaque at the distal left main that extends into the ostial LAD. LAD: Moderate stenosis (50-69%) visually closer to 50% due to eccentric calcified plaque at the ostial LAD (as discussed above). Moderate stenosis (50-69%) due to eccentric mixed plaque at proximal/mid LAD at the takeoff of the first diagonal branch. Mid to distal LAD is patent. First diagonal is large size, normal caliber, gives off superior and inferior branches and is overall patent appears to be LAD equivalent. Ramus: Patent. LCx: Patent. RCA: Patent. 4.  Aortic atherosclerosis. 5. Due to the degree of artifact present the study did not meet quality control standards for it to be sent for CT-FFR for further analysis.  LABORATORY DATA: Lipid Panel   2021-11-10    Cholesterol 207   <200  Cholesterol / HDL Ratio 2.76   0.00-4.44  HDL Cholesterol 75   >39  LDL Cholesterol (Calculation) 102   <130  LDL/HDL Ratio 1.4   <3.3  Non-HDL Cholesterol 132   <130  Triglycerides 148   <150  TSH   2021-11-10  TSH 1.22   0.27-4.20  Vitamin D 25-Hydroxy   2021-11-10    Vitamin D 25-Hydroxy 33.1   30.0-100.0  CBC With Platelet And Differential RS In-house   2021-11-10    Absolute Basophils 0.0   0.0-0.1  Absolute Eosinophils 0.1   0.0-0.6  Absolute Lymphocytes 1.2   0.9-3.6  Absolute Monocytes 0.4   0.3-1.0  Absolute Neutrophils 2.6   2.0-8.2  Basophils Automated 0.5   0.0-2.0  Eosinophils Automated 3.2   0.0-7.0  Hematocrit 37.7   37.0-47.0  Hemoglobin 12.2   11.5-15.0  Lymphocytes Automated 28.1   20.5-51.1  MCH 31.1   26.0-33.0  MCHC 32.4   32.0-36.0  MCV 96.2   82.0-101.0  Monocytes Automated 8.4   5.0-12.0  Neutrophils Automated 59.8    42.2-75.2  Platelet Count 210   140-400  RBC 3.92   4.20-5.40  RDW 42.1      WBC 4.4   4.5-11.0  Comprehensive Metabolic Panel RS In-house   2021-11-10    Albumin 3.7   3.4-5.0  Albumin/Globulin Ratio 1.2   1.1-2.5  Alkaline Phosphatase 47   25-150  ALT (SGPT) 33   <6-78  AST (SGOT) 27   0-40  Bilirubin, Total 0.5   0.2-1.0  BUN 14   7-18  BUN/Creatinine Ratio 18.2   11.0-26.0  Calcium 8.9   8.5-10.1  Chloride 105   98-107  CO2 25   21-32  Creatinine 0.77   0.55-1.02  GFR/Black 91   >59  GFR/White 79   >59  Globulin, Calculated 3.1   1.5-4.6  Glucose 94   74-106  Potassium 4.1   3.5-5.1  Protein 6.8   6.4-8.2  Sodium 139   136-145   External Labs: Collected: 05/18/2022 provided by primary care Total cholesterol 202, triglycerides 199, HDL 73, LDL 89, non-HDL 129 Sodium 139, potassium 4.5, chloride 103, bicarb 26, BUN 15, creatinine 0.85. AST 31, ALT 51, alkaline phosphatase 50  IMPRESSION:    ICD-10-CM   1. Coronary atherosclerosis due to calcified coronary lesion of native artery  I25.10    I25.84     2. Total CAC 151, 80th percentile  R93.1     3. Pure hypercholesterolemia  E78.00     4. Atherosclerosis of aorta (HCC)  I70.0        RECOMMENDATIONS: ESTEPHANY PEROT is a 71 y.o. Caucasian female whose past medical history and cardiac risk factors include: Moderate coronary calcification, hyperlipidemia, ulcerative colitis, endometrial cancer status post surgery and radiation, former smoker.   Coronary atherosclerosis due to calcified coronary lesion of native artery / Total CAC 151, 80th percentile Denies angina pectoris. Continue aspirin and statin therapy. Coronary CTA noted disease involving the distal left main and the ostial/proximal LAD distribution.  Due to the presence of artifact the coronary CTA study did not meet the quality control to be submitted for FFR evaluation. She did undergo exercise MPI which was negative for reversible ischemia.  Clinically is  exercising regularly and does not have any anginal discomfort. Educated on importance of improving her modifiable cardiovascular risk factors including lipids and triglycerides. Monitor for now.  Pure hypercholesterolemia Currently on rosuvastatin 20 mg p.o. nightly. Tolerating medication well without any side effects or intolerances. LDL levels have improved when compared to the prior, but still not at goal. Patient would like to continue with lifestyle changes as opposed to increasing pharmacological therapy. Would recommend a goal LDL of less than 70 mg/dL given the degree and location of her  atherosclerosis burden.  Patient will focus on lifestyle changes to improve her triglycerides and LDL levels.  She has annual well visit with PCP in December 2023 and I will see her after that to review her labs, symptoms, discuss management.  Patient is thankful for the care provided.  FINAL MEDICATION LIST END OF ENCOUNTER: No orders of the defined types were placed in this encounter.   There are no discontinued medications.    Current Outpatient Medications:    aspirin EC 81 MG tablet, Take 81 mg by mouth daily. Swallow whole., Disp: , Rfl:    calcium carbonate (OS-CAL) 600 MG TABS, Take 600 mg by mouth daily., Disp: , Rfl:    cholecalciferol (VITAMIN D) 1000 UNITS tablet, Take 1,000 Units by mouth daily., Disp: , Rfl:    Lactobacillus (ACIDOPHILUS PO), Take by mouth. Probiotic, Disp: , Rfl:    mesalamine (LIALDA) 1.2 g EC tablet, TAKE 4 TABLETS(4.8 GRAMS) BY MOUTH DAILY, Disp: 120 tablet, Rfl: 0   Multiple Vitamin (MULTIVITAMIN) tablet, Take 1 tablet by mouth daily., Disp: , Rfl:    OVER THE COUNTER MEDICATION, Biotin 10,000 mcg. One tablet daily., Disp: , Rfl:    Psyllium (FIBER) 0.52 G CAPS, Take 2 capsules by mouth daily., Disp: , Rfl:    rosuvastatin (CRESTOR) 20 MG tablet, Take 1 tablet (20 mg total) by mouth at bedtime., Disp: 90 tablet, Rfl: 0   TURMERIC PO, Take by mouth., Disp: ,  Rfl:    zinc gluconate 50 MG tablet, Take by mouth., Disp: , Rfl:    metoprolol tartrate (LOPRESSOR) 25 MG tablet, Take 1 tablet (25 mg total) by mouth 2 (two) times daily for 7 days., Disp: 14 tablet, Rfl: 0  No orders of the defined types were placed in this encounter.   There are no Patient Instructions on file for this visit.   --Continue cardiac medications as reconciled in final medication list. --Return in about 7 months (around 12/18/2022) for Follow up, CAD. Or sooner if needed. --Continue follow-up with your primary care physician regarding the management of your other chronic comorbid conditions.  Patient's questions and concerns were addressed to her satisfaction. She voices understanding of the instructions provided during this encounter.   This note was created using a voice recognition software as a result there may be grammatical errors inadvertently enclosed that do not reflect the nature of this encounter. Every attempt is made to correct such errors.  Rex Kras, [place], [facility]  Pager: [phone] Office: [phone]

## 2022-06-09 ENCOUNTER — Other Ambulatory Visit: Payer: Self-pay | Admitting: Gastroenterology

## 2022-06-15 ENCOUNTER — Ambulatory Visit
Admission: RE | Admit: 2022-06-15 | Discharge: 2022-06-15 | Disposition: A | Discharge status: Home / Self Care | Payer: Medicare HMO | Source: Ambulatory Visit | Attending: Obstetrics and Gynecology | Admitting: Obstetrics and Gynecology

## 2022-06-15 DIAGNOSIS — Z1231 Encounter for screening mammogram for malignant neoplasm of breast: Secondary | ICD-10-CM

## 2022-07-08 ENCOUNTER — Other Ambulatory Visit: Payer: Self-pay | Admitting: Cardiology

## 2022-07-08 DIAGNOSIS — I2510 Atherosclerotic heart disease of native coronary artery without angina pectoris: Secondary | ICD-10-CM

## 2022-07-08 DIAGNOSIS — E7800 Pure hypercholesterolemia, unspecified: Secondary | ICD-10-CM

## 2022-07-09 ENCOUNTER — Other Ambulatory Visit: Payer: Self-pay | Admitting: Gastroenterology

## 2022-09-03 ENCOUNTER — Encounter: Payer: Self-pay | Admitting: Physician Assistant

## 2022-09-03 ENCOUNTER — Ambulatory Visit: Payer: Medicare HMO | Admitting: Physician Assistant

## 2022-09-03 VITALS — BP 128/68 | HR 76 | Ht 62.0 in | Wt 136.6 lb

## 2022-09-03 DIAGNOSIS — K5100 Ulcerative (chronic) pancolitis without complications: Secondary | ICD-10-CM

## 2022-09-03 MED ORDER — NA SULFATE-K SULFATE-MG SULF 17.5-3.13-1.6 GM/177ML PO SOLN: 1.0000 | Freq: Once | ORAL | 0 refills | Status: AC

## 2022-09-03 MED ORDER — MESALAMINE 1.2 G PO TBEC: DELAYED_RELEASE_TABLET | ORAL | 4 refills | Status: DC

## 2022-09-03 NOTE — Progress Notes (Signed)
Subjective:    Patient ID: Autumn Knight, female    DOB: [birth year], 71 y.o.   MRN: [id number]  HPI  Autumn Knight is a pleasant 71 year old female, established with Dr. Ardis Hughs who was last seen here in December 2020, and comes in today for follow-up of ulcerative colitis, and to discuss follow-up colonoscopy. 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 Ardis Hughs in 2013.  Last colonoscopy was done in December 2020 with finding of evidence of prior colitis in the right colon with scarring but no inflammation and otherwise normal-appearing colon with the exception of external hemorrhoids.  Biopsies from the right colon showed no active inflammation and no dysplasia, biopsies from the left colon showed no active inflammation or dysplasia.  Follow-up was recommended in 3 years. She has been maintained on Lialda 4.8 g daily, and says she has been doing well.  She has not had any active symptoms over the past year and a half.  She says she has formed stools on a daily basis, occasionally sees a small amount of bright red blood which she attributes to known hemorrhoids.  She has no current complaints of abdominal pain cramping or discomfort.  She did have routine labs done June 2023-BUN 15/creatinine 0.85, LFTs within normal limits, no CBC was done.  Other medical problems include history of uterine cancer for which she underwent hysterectomy and radiation, hyperlipidemia.  Review of Systems Pertinent positive and negative review of systems were noted in the above HPI section.  All other review of systems was otherwise negative.   Outpatient Encounter Medications as of 09/03/2022  Medication Sig   aspirin EC 81 MG tablet Take 81 mg by mouth daily. Swallow whole.   calcium carbonate (OS-CAL) 600 MG TABS Take 600 mg by mouth daily.   cholecalciferol (VITAMIN D) 1000 UNITS tablet Take 1,000 Units by mouth daily.   Lactobacillus (ACIDOPHILUS PO) Take by  mouth. Probiotic   Multiple Vitamin (MULTIVITAMIN) tablet Take 1 tablet by mouth daily.   Na Sulfate-K Sulfate-Mg Sulf 17.5-3.13-1.6 GM/177ML SOLN Take 1 kit by mouth once for 1 dose.   OVER THE COUNTER MEDICATION Biotin 10,000 mcg. One tablet daily.   Psyllium (FIBER) 0.52 G CAPS Take 2 capsules by mouth daily.   rosuvastatin (CRESTOR) 20 MG tablet TAKE 1 TABLET(20 MG) BY MOUTH AT BEDTIME   TURMERIC PO Take by mouth.   zinc gluconate 50 MG tablet Take by mouth.   [DISCONTINUED] mesalamine (LIALDA) 1.2 g EC tablet TAKE 4 TABLETS(4.8 GRAMS) BY MOUTH DAILY   mesalamine (LIALDA) 1.2 g EC tablet Take 4 tablets by mouth at breakfast.   No facility-administered encounter medications on file as of 09/03/2022.   No Known Allergies Patient Active Problem List   Diagnosis Date Noted   Ulcerative colitis ([place]) 06/13/2019   Social History   Socioeconomic History   Marital status: Married    Spouse name: Not on file   Number of children: 2   Years of education: Not on file   Highest education level: Not on file  Occupational History   Occupation: Retired  Tobacco Use   Smoking status: Former    Types: Cigarettes    Quit date: [place]    Years since quitting: 45.8   Smokeless tobacco: Never  Vaping Use   Vaping Use: Never used  Substance and Sexual Activity   Alcohol use: Yes    Comment: 1 glass per week wine   Drug use: No  Sexual activity: Not on file  Other Topics Concern   Not on file  Social History Narrative   Not on file   Social Determinants of Health   Financial Resource Strain: Not on file  Food Insecurity: Not on file  Transportation Needs: Not on file  Physical Activity: Not on file  Stress: Not on file  Social Connections: Not on file  Intimate Partner Violence: Not on file    Ms. Methvin's family history includes Breast cancer (age of onset: 19) in her maternal grandmother; Cancer in her mother.      Objective:    Vitals:   09/03/22 0815  BP: 128/68   Pulse: 76  SpO2: 98%    Physical Exam Well-developed well-nourished older WF in no acute distress.  Height, HALPFX,902 BMI 24.9  HEENT; nontraumatic normocephalic, EOMI, PE R LA, sclera anicteric. Oropharynx;not done Neck; supple, no JVD Cardiovascular; regular rate and rhythm with S1-S2, no murmur rub or gallop Pulmonary; Clear bilaterally Abdomen; soft, nontender, nondistended, no palpable mass or hepatosplenomegaly, bowel sounds are active Rectal;not done today  Skin; benign exam, no jaundice rash or appreciable lesions Extremities; no clubbing cyanosis or edema skin warm and dry Neuro/Psych; alert and oriented x4, grossly nonfocal mood and affect appropriate        Assessment & Plan:   #9 71 year old white female with history of pan ulcerative colitis initially diagnosed in 1986, here today for routine follow-up and to schedule follow-up colonoscopy.  She has not had any evidence of active disease over the past several years.  Colonoscopy in 2017 was normal with negative biopsies and colonoscopy December 2020 again showed no active inflammation and no dysplasia. Due for 3-year interval surveillance. She has been maintained on Lialda 4.8 g daily.  No current concerns for active colitis, asymptomatic  #2 external hemorrhoids #3.  History of uterine cancer status post hysterectomy and radiation #4.  Hyperlipidemia  Plan; continue Lialda 4.8 g daily, refill sent x1 year Check CBC Patient will be scheduled for colonoscopy with Dr. Bryan Lemma (at patient's request as her husband had recently been evaluated by Dr. Bryan Lemma), and Dr. Ardis Hughs absence. Procedure was discussed in detail with the patient including indications risks and benefits and she is agreeable to proceed.   Genia Harold PA-C 09/03/2022   Cc: Deland Pretty, MD

## 2022-09-03 NOTE — Patient Instructions (Addendum)
You have been scheduled for a colonoscopy. Please follow written instructions given to you at your visit today.  Please pick up your prep supplies at the pharmacy within the next 1-3 days. If you use inhalers (even only as needed), please bring them with you on the day of your procedure.   Your provider has requested that you go to the basement level for lab work anytime by 09/10/22. Press "B" on the elevator. The lab is located at the first door on the left as you exit the elevator.   We have sent the following medications to your pharmacy for you to pick up at your convenience: Lialda   Due to recent changes in healthcare laws, you may see the results of your imaging and laboratory studies on MyChart before your provider has had a chance to review them.  We understand that in some cases there may be results that are confusing or concerning to you. Not all laboratory results come back in the same time frame and the provider may be waiting for multiple results in order to interpret others.  Please give [place] 48 hours in order for your provider to thoroughly review all the results before contacting the office for clarification of your results.    It was a pleasure to see you today!  Thank you for trusting me with your gastrointestinal care!

## 2022-09-08 ENCOUNTER — Other Ambulatory Visit (INDEPENDENT_AMBULATORY_CARE_PROVIDER_SITE_OTHER): Payer: Medicare HMO

## 2022-09-08 DIAGNOSIS — K5100 Ulcerative (chronic) pancolitis without complications: Secondary | ICD-10-CM | POA: Diagnosis not present

## 2022-09-08 LAB — CBC WITH DIFFERENTIAL/PLATELET
Basophils Absolute: 0 10*3/uL (ref 0.0–0.1)
Basophils Relative: 0.5 % (ref 0.0–3.0)
Eosinophils Absolute: 0.1 10*3/uL (ref 0.0–0.7)
Eosinophils Relative: 2.1 % (ref 0.0–5.0)
HCT: 37.2 % (ref 36.0–46.0)
Hemoglobin: 12.4 g/dL (ref 12.0–15.0)
Lymphocytes Relative: 31.9 % (ref 12.0–46.0)
Lymphs Abs: 1.9 10*3/uL (ref 0.7–4.0)
MCHC: 33.3 g/dL (ref 30.0–36.0)
MCV: 95.9 fl (ref 78.0–100.0)
Monocytes Absolute: 0.5 10*3/uL (ref 0.1–1.0)
Monocytes Relative: 8.2 % (ref 3.0–12.0)
Neutro Abs: 3.5 10*3/uL (ref 1.4–7.7)
Neutrophils Relative %: 57.3 % (ref 43.0–77.0)
Platelets: 222 10*3/uL (ref 150.0–400.0)
RBC: 3.88 Mil/uL (ref 3.87–5.11)
RDW: 12.6 % (ref 11.5–15.5)
WBC: 6 10*3/uL (ref 4.0–10.5)

## 2022-09-08 NOTE — Progress Notes (Signed)
Agree with the assessment and plan as outlined by Nicoletta Ba, PA-C.  Nyna Chilton, DO, [facility]

## 2022-09-28 ENCOUNTER — Encounter: Payer: Self-pay | Admitting: Gastroenterology

## 2022-09-28 ENCOUNTER — Ambulatory Visit (AMBULATORY_SURGERY_CENTER): Payer: Medicare HMO | Admitting: Gastroenterology

## 2022-09-28 VITALS — BP 141/79 | HR 64 | Temp 96.0°F | Resp 11 | Ht 62.0 in | Wt 136.0 lb

## 2022-09-28 DIAGNOSIS — K640 First degree hemorrhoids: Secondary | ICD-10-CM

## 2022-09-28 DIAGNOSIS — D120 Benign neoplasm of cecum: Secondary | ICD-10-CM

## 2022-09-28 DIAGNOSIS — K5730 Diverticulosis of large intestine without perforation or abscess without bleeding: Secondary | ICD-10-CM | POA: Diagnosis not present

## 2022-09-28 DIAGNOSIS — K5100 Ulcerative (chronic) pancolitis without complications: Secondary | ICD-10-CM

## 2022-09-28 DIAGNOSIS — K635 Polyp of colon: Secondary | ICD-10-CM | POA: Diagnosis not present

## 2022-09-28 MED ORDER — SODIUM CHLORIDE 0.9 % IV SOLN: 500.0000 mL | INTRAVENOUS | Status: DC

## 2022-09-28 NOTE — Patient Instructions (Signed)
Handouts on hemorrhoids, polyps, and diverticulosis given to patient. Clip card given to patient. (Make sure to keep this card with you for around 30 days - will fall off on it's own) Await pathology results. Resume previous diet and continue present medications. Repeat colonoscopy for surveillance will be determined based off of pathology results. Return to GI office as needed.  YOU HAD AN ENDOSCOPIC PROCEDURE TODAY AT [facility]:   Refer to the procedure report that was given to you for any specific questions about what was found during the examination.  If the procedure report does not answer your questions, please call your gastroenterologist to clarify.  If you requested that your care partner not be given the details of your procedure findings, then the procedure report has been included in a sealed envelope for you to review at your convenience later.  YOU SHOULD EXPECT: Some feelings of bloating in the abdomen. Passage of more gas than usual.  Walking can help get rid of the air that was put into your GI tract during the procedure and reduce the bloating. If you had a lower endoscopy (such as a colonoscopy or flexible sigmoidoscopy) you may notice spotting of blood in your stool or on the toilet paper. If you underwent a bowel prep for your procedure, you may not have a normal bowel movement for a few days.  Please Note:  You might notice some irritation and congestion in your nose or some drainage.  This is from the oxygen used during your procedure.  There is no need for concern and it should clear up in a day or so.  SYMPTOMS TO REPORT IMMEDIATELY:  Following lower endoscopy (colonoscopy or flexible sigmoidoscopy):  Excessive amounts of blood in the stool  Significant tenderness or worsening of abdominal pains  Swelling of the abdomen that is new, acute  Fever of 100F or higher  For urgent or emergent issues, a gastroenterologist can be reached at any hour by  calling [phone]. Do not use MyChart messaging for urgent concerns.    DIET:  We do recommend a small meal at first, but then you may proceed to your regular diet.  Drink plenty of fluids but you should avoid alcoholic beverages for 24 hours.  ACTIVITY:  You should plan to take it easy for the rest of today and you should NOT DRIVE or use heavy machinery until tomorrow (because of the sedation medicines used during the test).    FOLLOW UP: Our staff will call the number listed on your records the next business day following your procedure.  We will call around 7:15- 8:00 am to check on you and address any questions or concerns that you may have regarding the information given to you following your procedure. If we do not reach you, we will leave a message.     If any biopsies were taken you will be contacted by phone or by letter within the next 1-3 weeks.  Please call us at [phone] if you have not heard about the biopsies in 3 weeks.    SIGNATURES/CONFIDENTIALITY: You and/or your care partner have signed paperwork which will be entered into your electronic medical record.  These signatures attest to the fact that that the information above on your After Visit Summary has been reviewed and is understood.  Full responsibility of the confidentiality of this discharge information lies with you and/or your care-partner.

## 2022-09-28 NOTE — Progress Notes (Signed)
GASTROENTEROLOGY PROCEDURE H&P NOTE   Primary Care Physician: Deland Pretty, MD    Reason for Procedure:   Ulcerative Colitis   Plan:    Colonoscopy   Patient is appropriate for endoscopic procedure(s) in the ambulatory ([facility]) setting.  The nature of the procedure, as well as the risks, benefits, and alternatives were carefully and thoroughly reviewed with the patient. Ample time for discussion and questions allowed. The patient understood, was satisfied, and agreed to proceed.     HPI: Autumn Knight is a 71 y.o. female who presents for Colonoscopy for evaluation of Hx of UC .  Patient was most recently seen in the Gastroenterology Clinic on 09/03/2022.  No interval change in medical history since that appointment. Please refer to that note for full details regarding GI history and clinical presentation.   Past Medical History:  Diagnosis Date   Endometrial cancer ([place])    History of colon polyps    Hyperlipidemia    Osteopenia    Other and unspecified noninfectious gastroenteritis and colitis(558.9)    Ulcerative colitis ([place])     Past Surgical History:  Procedure Laterality Date   ABDOMINAL HYSTERECTOMY     BREAST EXCISIONAL BIOPSY Left 1980's    Prior to Admission medications   Medication Sig Start Date End Date Taking? Authorizing Provider  aspirin EC 81 MG tablet Take 81 mg by mouth daily. Swallow whole.   Yes [provider]  calcium carbonate (OS-CAL) 600 MG TABS Take 600 mg by mouth daily.   Yes [provider]  cholecalciferol (VITAMIN D) 1000 UNITS tablet Take 1,000 Units by mouth daily.   Yes [provider]  Lactobacillus (ACIDOPHILUS PO) Take by mouth. Probiotic   Yes [provider]  mesalamine (LIALDA) 1.2 g EC tablet Take 4 tablets by mouth at breakfast. 09/03/22  Yes Esterwood, Amy S, PA-C  Multiple Vitamin (MULTIVITAMIN) tablet Take 1 tablet by mouth daily.   Yes [provider]  OVER THE COUNTER MEDICATION  Biotin 10,000 mcg. One tablet daily.   Yes [provider]  rosuvastatin (CRESTOR) 20 MG tablet TAKE 1 TABLET(20 MG) BY MOUTH AT BEDTIME 07/08/22  Yes Tolia, Sunit, DO  TURMERIC PO Take by mouth.   Yes [provider]  zinc gluconate 50 MG tablet Take by mouth.   Yes [provider]  Psyllium (FIBER) 0.52 G CAPS Take 2 capsules by mouth daily.    [provider]    Current Outpatient Medications  Medication Sig Dispense Refill   aspirin EC 81 MG tablet Take 81 mg by mouth daily. Swallow whole.     calcium carbonate (OS-CAL) 600 MG TABS Take 600 mg by mouth daily.     cholecalciferol (VITAMIN D) 1000 UNITS tablet Take 1,000 Units by mouth daily.     Lactobacillus (ACIDOPHILUS PO) Take by mouth. Probiotic     mesalamine (LIALDA) 1.2 g EC tablet Take 4 tablets by mouth at breakfast. 360 tablet 4   Multiple Vitamin (MULTIVITAMIN) tablet Take 1 tablet by mouth daily.     OVER THE COUNTER MEDICATION Biotin 10,000 mcg. One tablet daily.     rosuvastatin (CRESTOR) 20 MG tablet TAKE 1 TABLET(20 MG) BY MOUTH AT BEDTIME 90 tablet 0   TURMERIC PO Take by mouth.     zinc gluconate 50 MG tablet Take by mouth.     Psyllium (FIBER) 0.52 G CAPS Take 2 capsules by mouth daily.     Current Facility-Administered Medications  Medication Dose Route  Frequency Provider Last Rate Last Admin   0.9 %  sodium chloride infusion  500 mL Intravenous Continuous Mardee Clune V, DO        Allergies as of 09/28/2022   (No Known Allergies)    Family History  Problem Relation Age of Onset   Cancer Mother        Blood   Breast cancer Maternal Grandmother 85   Colon cancer Neg Hx    Stomach cancer Neg Hx    Rectal cancer Neg Hx    Pancreatic cancer Neg Hx    Esophageal cancer Neg Hx     Social History   Socioeconomic History   Marital status: Married    Spouse name: Not on file   Number of children: 2   Years of education: Not on file   Highest education level: Not on  file  Occupational History   Occupation: Retired  Tobacco Use   Smoking status: Former    Types: Cigarettes    Quit date: 1978    Years since quitting: 45.8   Smokeless tobacco: Never  Vaping Use   Vaping Use: Never used  Substance and Sexual Activity   Alcohol use: Yes    Comment: 1 glass per week wine   Drug use: No   Sexual activity: Not on file  Other Topics Concern   Not on file  Social History Narrative   Not on file   Social Determinants of Health   Financial Resource Strain: Not on file  Food Insecurity: Not on file  Transportation Needs: Not on file  Physical Activity: Not on file  Stress: Not on file  Social Connections: Not on file  Intimate Partner Violence: Not on file    Physical Exam: Vital signs in last 24 hours: @BP  (!) 127/58   Pulse 70   Temp (!) 96 F (35.6 C)   Ht 5' 2"  (1.575 m)   Wt 136 lb (61.7 kg)   SpO2 98%   BMI 24.87 kg/m  GEN: NAD EYE: Sclerae anicteric ENT: MMM CV: Non-tachycardic Pulm: CTA b/l GI: Soft, NT/ND NEURO:  Alert & Oriented x 3   Gerrit Heck, DO [facility]   09/28/2022 4:36 PM

## 2022-09-28 NOTE — Progress Notes (Signed)
Called to room to assist during endoscopic procedure.  Patient ID and intended procedure confirmed with present staff. Received instructions for my participation in the procedure from the performing physician.  

## 2022-09-28 NOTE — Progress Notes (Signed)
Sedate, gd SR, tolerated procedure well, VSS, report to RN 

## 2022-09-28 NOTE — Op Note (Signed)
[place] Patient Name: Autumn Knight Procedure Date: 09/28/2022 4:27 PM MRN: [id number] Endoscopist: Gerrit Heck , MD, [id number] Age: 71 Referring MD:  Date of Birth: [birth year] Gender: Female Account #: [id number] Procedure:                Colonoscopy Indications:              Follow-up of chronic ulcerative pancolitis, Disease                            activity assessment of chronic ulcerative                            pancolitis, dysplasia surveillance                           Currently in clinical remission with Lialda 4.8                            gm/day. Medicines:                Monitored Anesthesia Care Procedure:                Pre-Anesthesia Assessment:                           - Prior to the procedure, a History and Physical                            was performed, and patient medications and                            allergies were reviewed. The patient's tolerance of                            previous anesthesia was also reviewed. The risks                            and benefits of the procedure and the sedation                            options and risks were discussed with the patient.                            All questions were answered, and informed consent                            was obtained. Prior Anticoagulants: The patient has                            taken no anticoagulant or antiplatelet agents. ASA                            Grade Assessment: II - A patient with mild systemic  disease. After reviewing the risks and benefits,                            the patient was deemed in satisfactory condition to                            undergo the procedure.                           After obtaining informed consent, the colonoscope                            was passed under direct vision. Throughout the                            procedure, the patient's blood pressure, pulse, and                             oxygen saturations were monitored continuously. The                            Olympus CF-HQ190L (Serial# [id number]) Colonoscope was                            introduced through the anus and advanced to the the                            terminal ileum. The colonoscopy was performed                            without difficulty. The patient tolerated the                            procedure well. The quality of the bowel                            preparation was good. The terminal ileum, ileocecal                            valve, appendiceal orifice, and rectum were                            photographed. Scope In: 4:43:48 PM Scope Out: 5:14:41 PM Scope Withdrawal Time: 0 hours 22 minutes 4 seconds  Total Procedure Duration: 0 hours 30 minutes 53 seconds  Findings:                 The perianal and digital rectal examinations were                            normal.                           A 15 mm polyp was found in the cecum. The polyp was  flat with adherent mucus cap. The polyp was removed                            with a cold snare. Resection and retrieval were                            complete. There was mild, peristent oozing at the                            polypectomy site. For hemostasis, one hemostatic                            clip was successfully placed (MR conditional).                            There was no bleeding at the end of the procedure.                           The right colon and proximal transverse colon had a                            scarred appearance consistent with known history of                            Ulcerative Colitis. This appeared similar to the                            previous colonoscopy. The rectum, sigmoid colon,                            descending colon, and distal transverse colon                            otherwise all appeared normal. Biopsies were taken                            throughout the colon  with a cold forceps for                            histology. Estimated blood loss was minimal.                           A few small-mouthed diverticula were found in the                            sigmoid colon.                           Non-bleeding internal hemorrhoids were found during                            retroflexion. The hemorrhoids were small.  The terminal ileum appeared normal. Complications:            No immediate complications. Estimated Blood Loss:     Estimated blood loss was minimal. Impression:               - One 15 mm polyp in the cecum, removed with a cold                            snare. Resected and retrieved. Clip (MR                            conditional) was placed.                           - The rectum, sigmoid colon, descending colon and                            distal transverse colon are normal. Biopsied.                           - Diverticulosis in the sigmoid colon.                           - Non-bleeding internal hemorrhoids.                           - The examined portion of the ileum was normal. Recommendation:           - Patient has a contact number available for                            emergencies. The signs and symptoms of potential                            delayed complications were discussed with the                            patient. Return to normal activities tomorrow.                            Written discharge instructions were provided to the                            patient.                           - Resume previous diet.                           - Continue present medications.                           - Await pathology results.                           - Repeat colonoscopy for surveillance based on  pathology results. Gerrit Heck, MD 09/28/2022 5:24:05 PM

## 2022-09-29 ENCOUNTER — Telehealth: Payer: Self-pay | Admitting: *Deleted

## 2022-09-29 NOTE — Telephone Encounter (Signed)
  Follow up Call-     09/28/2022    3:29 PM  Call back number  Post procedure Call Back phone  # [phone]  Permission to leave phone message Yes     Patient questions:  Do you have a fever, pain , or abdominal swelling? No. Pain Score  0 *  Have you tolerated food without any problems? Yes.    Have you been able to return to your normal activities? Yes.    Do you have any questions about your discharge instructions: Diet   No. Medications  No. Follow up visit  No.  Do you have questions or concerns about your Care? No.  Actions: * If pain score is 4 or above: No action needed, pain <4.

## 2022-10-02 ENCOUNTER — Other Ambulatory Visit (HOSPITAL_BASED_OUTPATIENT_CLINIC_OR_DEPARTMENT_OTHER): Payer: Self-pay

## 2022-10-02 MED ORDER — COMIRNATY 30 MCG/0.3ML IM SUSY
PREFILLED_SYRINGE | INTRAMUSCULAR | 0 refills | Status: DC
  Filled 2022-10-02: qty 0.3, 1d supply, fill #0

## 2022-10-03 ENCOUNTER — Other Ambulatory Visit (HOSPITAL_COMMUNITY): Payer: Self-pay

## 2022-10-07 ENCOUNTER — Other Ambulatory Visit: Payer: Self-pay | Admitting: Cardiology

## 2022-10-07 DIAGNOSIS — E7800 Pure hypercholesterolemia, unspecified: Secondary | ICD-10-CM

## 2022-10-07 DIAGNOSIS — I2510 Atherosclerotic heart disease of native coronary artery without angina pectoris: Secondary | ICD-10-CM

## 2022-11-26 ENCOUNTER — Encounter: Payer: Self-pay | Admitting: Cardiology

## 2022-11-26 ENCOUNTER — Ambulatory Visit: Payer: Medicare HMO | Admitting: Cardiology

## 2022-11-26 VITALS — BP 135/76 | HR 77 | Resp 14 | Ht 62.0 in | Wt 139.4 lb

## 2022-11-26 DIAGNOSIS — I700 Atherosclerosis of aorta: Secondary | ICD-10-CM

## 2022-11-26 DIAGNOSIS — E7800 Pure hypercholesterolemia, unspecified: Secondary | ICD-10-CM

## 2022-11-26 DIAGNOSIS — E781 Pure hyperglyceridemia: Secondary | ICD-10-CM

## 2022-11-26 DIAGNOSIS — I2510 Atherosclerotic heart disease of native coronary artery without angina pectoris: Secondary | ICD-10-CM

## 2022-11-26 DIAGNOSIS — R931 Abnormal findings on diagnostic imaging of heart and coronary circulation: Secondary | ICD-10-CM

## 2022-11-26 MED ORDER — FENOFIBRATE 145 MG PO TABS: 145.0000 mg | ORAL_TABLET | Freq: Every day | ORAL | 0 refills | Status: DC

## 2022-11-26 NOTE — Progress Notes (Signed)
ID:  Autumn Knight, DOB November 19, 1951, MRN [id number]  PCP:  Deland Pretty, MD  Cardiologist:  Rex Kras, DO, [facility] (established care 03/03/2022)  Date: 11/26/22 Last Office Visit: 06/02/2022  Chief Complaint  Patient presents with   Coronary Artery Disease   Follow-up    7 months    HPI  Autumn Knight is a 72 y.o. Caucasian female whose past medical history and cardiovascular risk factors include: Moderate coronary calcification, hyperlipidemia, ulcerative colitis, endometrial cancer status post surgery and radiation, former smoker.   Referred to practice for evaluation of coronary artery calcification.  Workup illustrated a total CAC of 115, placing her at the 88th percentile.  Majority of the calcium was noted in the left main/LAD distribution.  She underwent nuclear stress test which was reported to be low risk.  Her lipids were not well-controlled the patient preferred to work on lifestyle changes as opposed to up titration of pharmacological therapy.  She presents today for 16-monthfollow-up visit.  Denies anginal discomfort or heart failure symptoms.  Outside labs from December 2023 independently reviewed from care everywhere noted below for further reference.  Overall functional capacity remains stable.  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   ALLERGIES: No Known Allergies  MEDICATION LIST PRIOR TO VISIT: Current Meds  Medication Sig   aspirin EC 81 MG tablet Take 81 mg by mouth daily. Swallow whole.   calcium carbonate (OS-CAL) 600 MG TABS Take 600 mg by mouth daily.   cholecalciferol (VITAMIN D) 1000 UNITS tablet Take 1,000 Units by mouth daily.   COVID-19 mRNA vaccine 2023-2024 (COMIRNATY) syringe Inject into the muscle.   fenofibrate (TRICOR) 145 MG tablet Take 1 tablet (145 mg total) by mouth daily.   Lactobacillus (ACIDOPHILUS PO) Take by mouth. Probiotic   mesalamine (LIALDA) 1.2 g EC tablet Take 4 tablets by mouth at  breakfast.   Multiple Vitamin (MULTIVITAMIN) tablet Take 1 tablet by mouth daily.   OVER THE COUNTER MEDICATION Biotin 10,000 mcg. One tablet daily.   Psyllium (FIBER) 0.52 G CAPS Take 2 capsules by mouth daily.   rosuvastatin (CRESTOR) 20 MG tablet TAKE 1 TABLET(20 MG) BY MOUTH AT BEDTIME   TURMERIC PO Take by mouth.   zinc gluconate 50 MG tablet Take by mouth.     PAST MEDICAL HISTORY: Past Medical History:  Diagnosis Date   [place]    History of colon polyps    Hyperlipidemia    Osteopenia    Other and unspecified noninfectious gastroenteritis and colitis(558.9)    [place]cerative colitis [place]     PAST SURGICAL HISTORY: Past Surgical History:  Procedure Laterality Date   ABDOMINAL HYSTERECTOMY     BREAST EXCISIONAL BIOPSY Left 1980's    FAMILY HISTORY: The patient family history includes ALS in her brother; Breast cancer (age of onset: 866 in her maternal grandmother; Cancer in her mother.  SOCIAL HISTORY:  The patient  reports that she quit smoking about 46 years ago. Her smoking use included cigarettes. She has never used smokeless tobacco. She reports current alcohol use. She reports that she does not use drugs.  REVIEW OF SYSTEMS: Review of Systems  Cardiovascular:  Negative for chest pain, claudication, dyspnea on exertion, leg swelling, near-syncope, orthopnea, palpitations, paroxysmal nocturnal dyspnea and syncope.    PHYSICAL EXAM:    11/26/2022   10:13 AM 09/28/2022    5:39 PM 09/28/2022    5:29 PM  Vitals with BMI  Height 5'  2"    Weight 139 lbs 6 oz    BMI 29.52    Systolic 841 324 401  Diastolic 76 79 76  Pulse 77 64 69   Physical Exam  Constitutional: No distress.  Age appropriate, hemodynamically stable.   Neck: No JVD present.  Cardiovascular: Normal rate, regular rhythm, S1 normal, S2 normal, intact distal pulses and normal pulses. Exam reveals no gallop, no S3 and no S4.  No murmur heard. Pulses:      Dorsalis pedis pulses are 2+  on the right side and 2+ on the left side.       Posterior tibial pulses are 2+ on the right side and 2+ on the left side.  Pulmonary/Chest: Effort normal and breath sounds normal. No stridor. She has no wheezes. She has no rales.  Abdominal: Soft. Bowel sounds are normal. She exhibits no distension. There is no abdominal tenderness.  Musculoskeletal:        General: No edema.     Cervical back: Neck supple.  Neurological: She is alert and oriented to person, place, and time. She has intact cranial nerves (2-12).  Skin: Skin is warm and moist.   CARDIAC DATABASE: EKG: 11/26/2022: Sinus rhythm, 66 bpm, without underlying ischemia or injury pattern.  Echocardiogram: 03/10/2022: Normal LV systolic function with visual EF 60-65%. Left ventricle cavity is normal in size. Normal left ventricular wall thickness. Normal global wall motion. Normal diastolic filling pattern, normal LAP.  Mild (Grade I) mitral regurgitation. No prior study for comparison.   Stress Testing: Exercise Myoview stress test 05/06/2022: Exercise nuclear stress test was performed using Bruce protocol.  1 Day Rest and Stress images. Exercise time 6 minutes 1 second, achieved 7.05 METS, 93%APMHR.  Stress ECG negative for ischemia. Normal myocardial perfusion without convincing evidence of reversible myocardial ischemia or prior infarct. Left ventricular size normal, wall motion preserved, calculated LVEF 73%. Low risk study.  Heart Catheterization: None  CCTA: 03/24/2022 1. Total coronary calcium score of 151. This was 80th percentile for age and sex matched control. 2. Normal coronary origin with right dominance. 3. CAD-RADS = 3. Left Main: Moderate stenosis (50-69%) closer to 50% due to eccentric calcified plaque at the distal left main that extends into the ostial LAD. LAD: Moderate stenosis (50-69%) visually closer to 50% due to eccentric calcified plaque at the ostial LAD (as discussed above). Moderate stenosis  (50-69%) due to eccentric mixed plaque at proximal/mid LAD at the takeoff of the first diagonal branch. Mid to distal LAD is patent. First diagonal is large size, normal caliber, gives off superior and inferior branches and is overall patent appears to be LAD equivalent. Ramus: Patent. LCx: Patent. RCA: Patent. 4.  Aortic atherosclerosis. 5. Due to the degree of artifact present the study did not meet quality control standards for it to be sent for CT-FFR for further analysis.  LABORATORY DATA: Lipid Panel   2021-11-10    Cholesterol 207   <200  Cholesterol / HDL Ratio 2.76   0.00-4.44  HDL Cholesterol 75   >39  LDL Cholesterol (Calculation) 102   <130  LDL/HDL Ratio 1.4   <3.3  Non-HDL Cholesterol 132   <130  Triglycerides 148   <150  TSH   2021-11-10    TSH 1.22   0.27-4.20  Vitamin D 25-Hydroxy   2021-11-10    Vitamin D 25-Hydroxy 33.1   30.0-100.0  CBC With Platelet And Differential RS In-house   2021-11-10    Absolute Basophils 0.0  0.0-0.1  Absolute Eosinophils 0.1   0.0-0.6  Absolute Lymphocytes 1.2   0.9-3.6  Absolute Monocytes 0.4   0.3-1.0  Absolute Neutrophils 2.6   2.0-8.2  Basophils Automated 0.5   0.0-2.0  Eosinophils Automated 3.2   0.0-7.0  Hematocrit 37.7   37.0-47.0  Hemoglobin 12.2   11.5-15.0  Lymphocytes Automated 28.1   20.5-51.1  MCH 31.1   26.0-33.0  MCHC 32.4   32.0-36.0  MCV 96.2   82.0-101.0  Monocytes Automated 8.4   5.0-12.0  Neutrophils Automated 59.8   42.2-75.2  Platelet Count 210   140-400  RBC 3.92   4.20-5.40  RDW 42.1      WBC 4.4   4.5-11.0  Comprehensive Metabolic Panel RS In-house   2021-11-10    Albumin 3.7   3.4-5.0  Albumin/Globulin Ratio 1.2   1.1-2.5  Alkaline Phosphatase 47   25-150  ALT (SGPT) 33   <6-78  AST (SGOT) 27   0-40  Bilirubin, Total 0.5   0.2-1.0  BUN 14   7-18  BUN/Creatinine Ratio 18.2   11.0-26.0  Calcium 8.9   8.5-10.1  Chloride 105   98-107  CO2 25   21-32  Creatinine 0.77   0.55-1.02  GFR/Black 91    >59  GFR/White 79   >59  Globulin, Calculated 3.1   1.5-4.6  Glucose 94   74-106  Potassium 4.1   3.5-5.1  Protein 6.8   6.4-8.2  Sodium 139   136-145   External Labs: Collected: 05/18/2022 provided by primary care Total cholesterol 202, triglycerides 199, HDL 73, LDL 89, non-HDL 129 Sodium 139, potassium 4.5, chloride 103, bicarb 26, BUN 15, creatinine 0.85. AST 31, ALT 51, alkaline phosphatase 50  External Labs: Collected: 11/18/2022 available in Care Everywhere. Total cholesterol 204, triglycerides 186, HDL 83, LDL 84, non-HDL 121. Hemoglobin 12.8, hematocrit 39.8%. Sodium 137, potassium 3.9, chloride 101, bicarb 27 BUN 13, creatinine 0.78. AST 39, ALT 51, alkaline phosphatase 53   IMPRESSION:    ICD-10-CM   1. Coronary atherosclerosis due to calcified coronary lesion of native artery  I25.10 EKG 12-Lead   I25.84 fenofibrate (TRICOR) 145 MG tablet    CMP14+EGFR    Lipid Panel With LDL/HDL Ratio    LDL cholesterol, direct    Lipoprotein A (LPA)    2. Total CAC 151, 80th percentile  R93.1     3. Hypertriglyceridemia  E78.1 fenofibrate (TRICOR) 145 MG tablet    CMP14+EGFR    Lipid Panel With LDL/HDL Ratio    LDL cholesterol, direct    Lipoprotein A (LPA)    4. Pure hypercholesterolemia  E78.00     5. Atherosclerosis of aorta (HCC)  I70.0        RECOMMENDATIONS: Autumn Knight is a 72 y.o. Caucasian female whose past medical history and cardiac risk factors include: Moderate coronary calcification, hyperlipidemia, ulcerative colitis, endometrial cancer status post surgery and radiation, former smoker.   Coronary atherosclerosis due to calcified coronary lesion of native artery / Total CAC 151, 80th percentile Denies angina pectoris. Continue aspirin and statin therapy. Exercise MPI negative for reversible ischemia overall low risk study. No additional cardiovascular testing warranted at this time. Outside labs independently reviewed.  Lipids have improved but  triglycerides are not at goal. We discussed continuing lifestyle changes versus pharmacological therapy and patient is agreeable with addition of fenofibrate to help improve her triglycerides.  Will check lipids in 6 weeks to reevaluate TG and LFTs.  Pure hypercholesterolemia Currently on rosuvastatin.  She denies myalgia or other side effects. Most recent lipids dated December 2023, independently reviewed as noted above. Recommend a goal LDL less than 70 mg/dL  FINAL MEDICATION LIST END OF ENCOUNTER: Meds ordered this encounter  Medications   fenofibrate (TRICOR) 145 MG tablet    Sig: Take 1 tablet (145 mg total) by mouth daily.    Dispense:  90 tablet    Refill:  0    There are no discontinued medications.    Current Outpatient Medications:    aspirin EC 81 MG tablet, Take 81 mg by mouth daily. Swallow whole., Disp: , Rfl:    calcium carbonate (OS-CAL) 600 MG TABS, Take 600 mg by mouth daily., Disp: , Rfl:    cholecalciferol (VITAMIN D) 1000 UNITS tablet, Take 1,000 Units by mouth daily., Disp: , Rfl:    COVID-19 mRNA vaccine 2023-2024 (COMIRNATY) syringe, Inject into the muscle., Disp: 0.3 mL, Rfl: 0   fenofibrate (TRICOR) 145 MG tablet, Take 1 tablet (145 mg total) by mouth daily., Disp: 90 tablet, Rfl: 0   Lactobacillus (ACIDOPHILUS PO), Take by mouth. Probiotic, Disp: , Rfl:    mesalamine (LIALDA) 1.2 g EC tablet, Take 4 tablets by mouth at breakfast., Disp: 360 tablet, Rfl: 4   Multiple Vitamin (MULTIVITAMIN) tablet, Take 1 tablet by mouth daily., Disp: , Rfl:    OVER THE COUNTER MEDICATION, Biotin 10,000 mcg. One tablet daily., Disp: , Rfl:    Psyllium (FIBER) 0.52 G CAPS, Take 2 capsules by mouth daily., Disp: , Rfl:    rosuvastatin (CRESTOR) 20 MG tablet, TAKE 1 TABLET(20 MG) BY MOUTH AT BEDTIME, Disp: 90 tablet, Rfl: 0   TURMERIC PO, Take by mouth., Disp: , Rfl:    zinc gluconate 50 MG tablet, Take by mouth., Disp: , Rfl:   Orders Placed This Encounter  Procedures    CMP14+EGFR   Lipid Panel With LDL/HDL Ratio   LDL cholesterol, direct   Lipoprotein A (LPA)   EKG 12-Lead   There are no Patient Instructions on file for this visit.   --Continue cardiac medications as reconciled in final medication list. --Return in about 6 months (around 05/27/2023) for Follow up, Coronary artery calcification, aortic atherosclerosis. . Or sooner if needed. --Continue follow-up with your primary care physician regarding the management of your other chronic comorbid conditions.  Patient's questions and concerns were addressed to her satisfaction. She voices understanding of the instructions provided during this encounter.   This note was created using a voice recognition software as a result there may be grammatical errors inadvertently enclosed that do not reflect the nature of this encounter. Every attempt is made to correct such errors.  Rex Kras, [place], [facility]  Pager: [phone] Office: [phone]

## 2023-01-01 ENCOUNTER — Other Ambulatory Visit: Payer: Self-pay | Admitting: Cardiology

## 2023-01-01 DIAGNOSIS — E7800 Pure hypercholesterolemia, unspecified: Secondary | ICD-10-CM

## 2023-01-01 DIAGNOSIS — I2510 Atherosclerotic heart disease of native coronary artery without angina pectoris: Secondary | ICD-10-CM

## 2023-01-08 LAB — LIPID PANEL WITH LDL/HDL RATIO
Cholesterol, Total: 179 mg/dL (ref 100–199)
HDL: 82 mg/dL (ref >39)
LDL Chol Calc (NIH): 81 mg/dL (ref 0–99)
LDL/HDL Ratio: 1 ratio (ref 0.0–3.2)
Triglycerides: 89 mg/dL (ref 0–149)
VLDL Cholesterol Cal: 16 mg/dL (ref 5–40)

## 2023-01-08 LAB — CMP14+EGFR
ALT: 16 IU/L (ref 0–32)
AST: 21 IU/L (ref 0–40)
Albumin/Globulin Ratio: 2.1 (ref 1.2–2.2)
Albumin: 4.8 g/dL (ref 3.9–4.9)
Alkaline Phosphatase: 43 IU/L — ABNORMAL LOW (ref 44–121)
BUN/Creatinine Ratio: 16 (ref 12–28)
BUN: 16 mg/dL (ref 8–27)
Bilirubin Total: 0.4 mg/dL (ref 0.0–1.2)
CO2: 23 mmol/L (ref 20–29)
Calcium: 9.6 mg/dL (ref 8.7–10.3)
Chloride: 103 mmol/L (ref 96–106)
Creatinine, Ser: 0.97 mg/dL (ref 0.57–1.00)
Globulin, Total: 2.3 g/dL (ref 1.5–4.5)
Glucose: 91 mg/dL (ref 70–99)
Potassium: 4.2 mmol/L (ref 3.5–5.2)
Sodium: 140 mmol/L (ref 134–144)
Total Protein: 7.1 g/dL (ref 6.0–8.5)
eGFR: 63 mL/min/{1.73_m2} (ref >59)

## 2023-01-08 LAB — LDL CHOLESTEROL, DIRECT: LDL Direct: 75 mg/dL (ref 0–99)

## 2023-01-11 NOTE — Progress Notes (Signed)
Gave patient results, she acknowledged understanding and had no further questions.

## 2023-02-16 ENCOUNTER — Other Ambulatory Visit: Payer: Self-pay | Admitting: Cardiology

## 2023-02-16 DIAGNOSIS — E781 Pure hyperglyceridemia: Secondary | ICD-10-CM

## 2023-02-16 DIAGNOSIS — I2510 Atherosclerotic heart disease of native coronary artery without angina pectoris: Secondary | ICD-10-CM

## 2023-03-26 ENCOUNTER — Other Ambulatory Visit: Payer: Self-pay | Admitting: Registered Nurse

## 2023-03-26 DIAGNOSIS — K769 Liver disease, unspecified: Secondary | ICD-10-CM

## 2023-03-29 ENCOUNTER — Other Ambulatory Visit: Payer: Self-pay | Admitting: Cardiology

## 2023-03-29 DIAGNOSIS — I2510 Atherosclerotic heart disease of native coronary artery without angina pectoris: Secondary | ICD-10-CM

## 2023-03-29 DIAGNOSIS — E7800 Pure hypercholesterolemia, unspecified: Secondary | ICD-10-CM

## 2023-04-08 ENCOUNTER — Encounter: Payer: Self-pay | Admitting: Registered Nurse

## 2023-05-05 IMAGING — CT CT CARDIAC CORONARY ARTERY CALCIUM SCORE
3 series · 14 of 20 positions shown, 16 images · non-contrast
Comparison: None.

CLINICAL DATA: Hypercholesterolemia

EXAM:
CT CARDIAC CORONARY ARTERY CALCIUM SCORE
TECHNIQUE: Non-contrast imaging through the heart was performed using
prospective ECG gating. Image post processing was performed on an
independent workstation, allowing for quantitative analysis of the
heart and coronary arteries. Note that this exam targets the heart
and the chest was not imaged in its entirety.

[Series 2: calcium scoring 2.00 qr36 bestdiast 69% hrt calciu · axial · 0.37mm/px · z∈[+1591,+1687]mm · 4 of 80 slices shown]
[im 16/80  vessel]
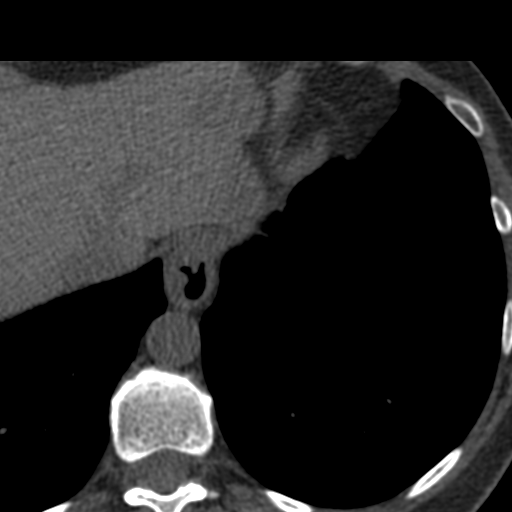
[im 32/80  vessel]
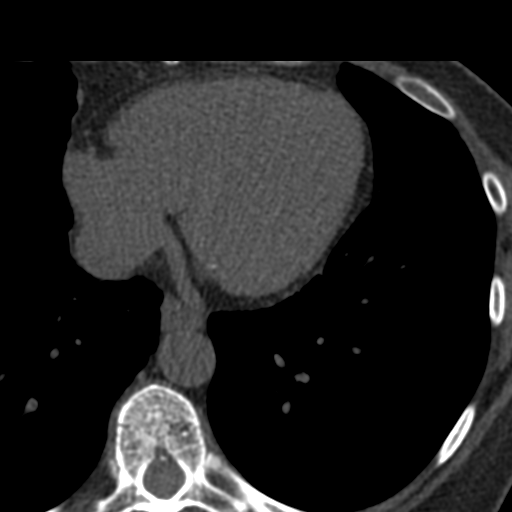
[im 48/80  vessel]
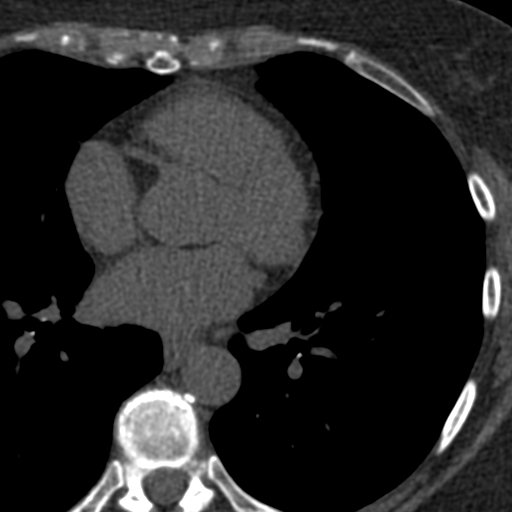
[im 64/80  vessel]
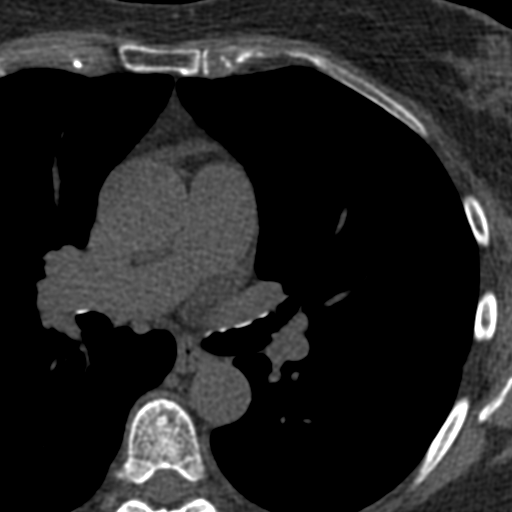

[Series 3: calcium scoring 2.00 br40 bestdiast 69% axial · axial · 0.58mm/px · z∈[+1587,+1691]mm · 5 of 80 slices shown, 7 images]
[im 14/80  vessel]
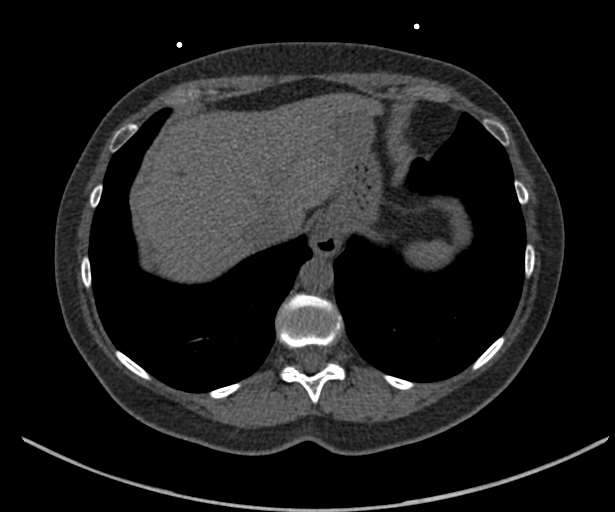
[im 14/80  lung]
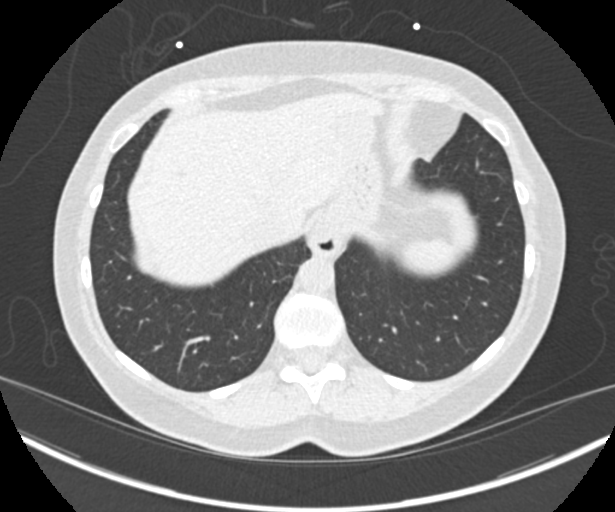
[im 27/80  vessel]
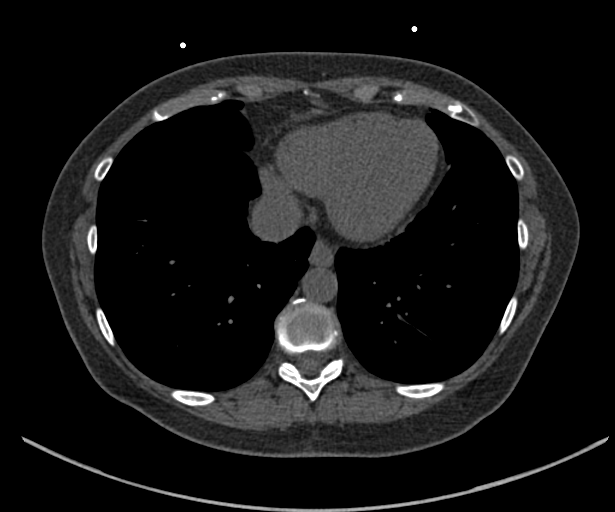
[im 40/80  vessel]
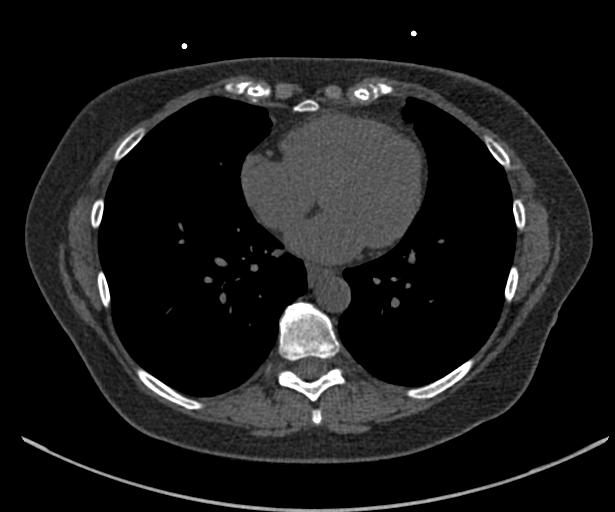
[im 53/80  vessel]
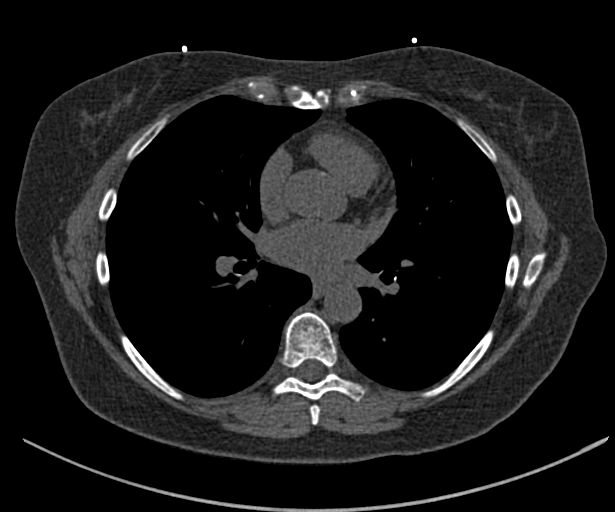
[im 66/80  vessel]
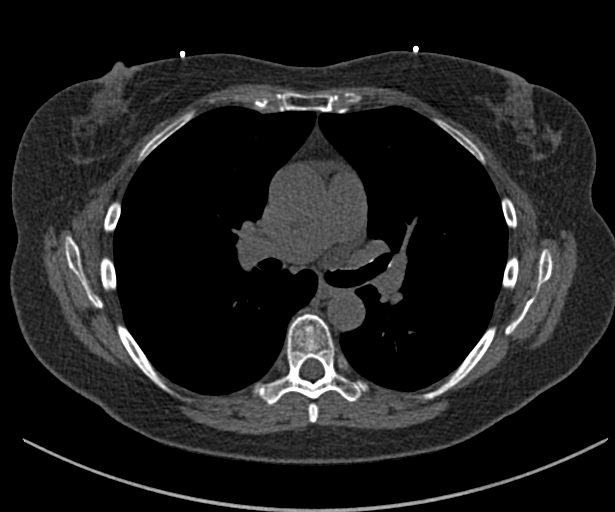
[im 66/80  lung]
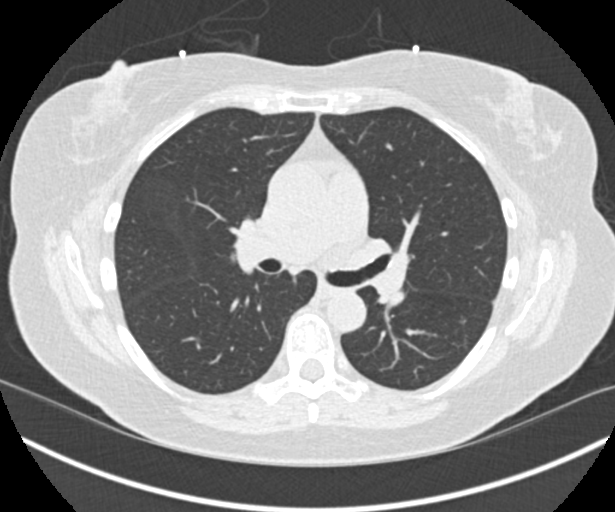

[Series 9: calcium scoring 2.00 br60 bestdiast 69% lungs · axial · 0.56mm/px · z∈[+1587,+1691]mm · 5 of 80 slices shown]
[im 14/80  vessel]
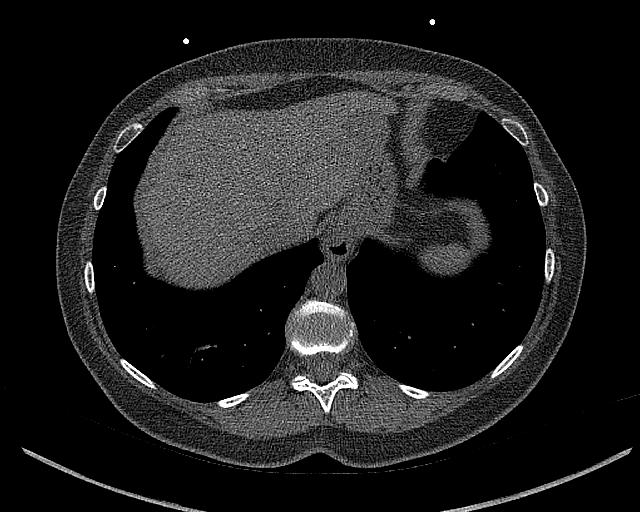
[im 27/80  vessel]
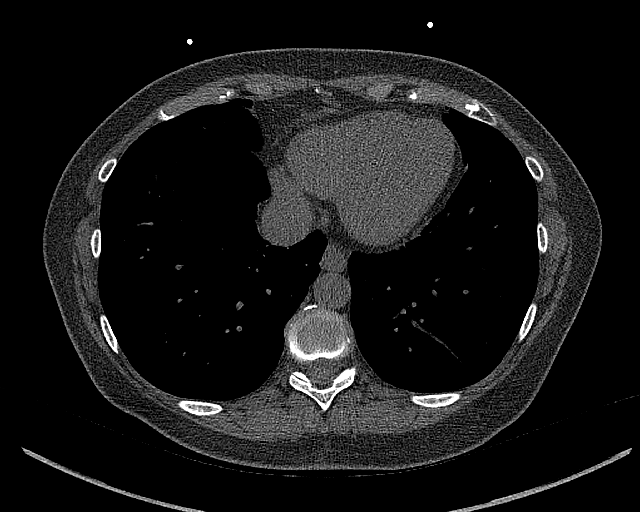
[im 40/80  vessel]
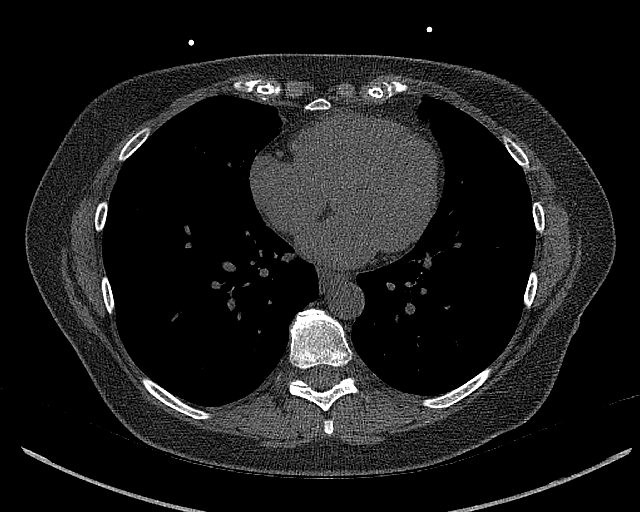
[im 53/80  vessel]
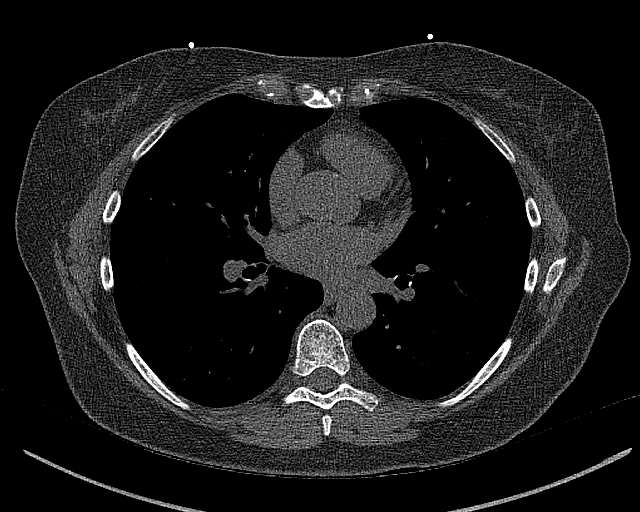
[im 66/80  vessel]
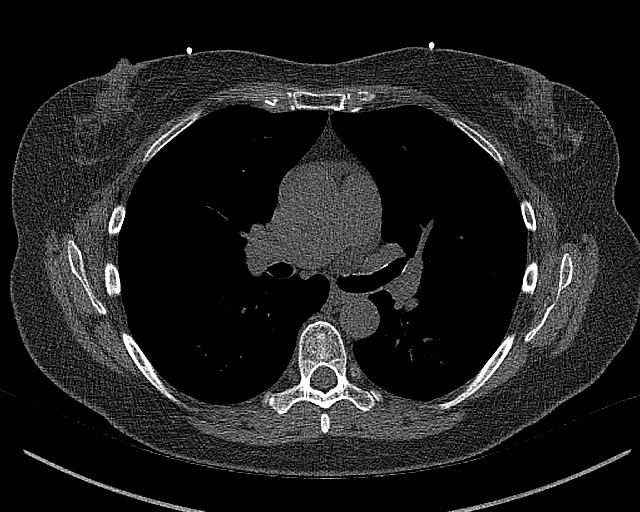

[14 of 20 positions shown; findings below may reference images not displayed]

FINDINGS: CORONARY CALCIUM SCORES:

Left Main:

LAD:

LCx: 0

RCA: 0

Total Agatston Score: 158

[HOSPITAL] percentile: 80

AORTA MEASUREMENTS:

Ascending Aorta: 32 mm

Descending Aorta: 20 mm

OTHER FINDINGS:

Heart is normal size. Scattered aortic root calcifications and
descending aortic calcifications. No aneurysm. No adenopathy. No
confluent airspace opacities or effusions. Suggestion of low-density
areas within the tip of the left hepatic lobe, anteriorly and
laterally in the right hepatic lobes. These are partially imaged and
difficult to visualize. Chest wall soft tissues are unremarkable. No
acute bony abnormality.
IMPRESSION: Total Agatston score: 158

[HOSPITAL] percentile: 80

Questionable low-density lesions within the visualized liver.
Recommend further evaluation with right upper quadrant ultrasound.

## 2023-05-07 ENCOUNTER — Other Ambulatory Visit: Payer: Self-pay | Admitting: Internal Medicine

## 2023-05-07 DIAGNOSIS — Z1231 Encounter for screening mammogram for malignant neoplasm of breast: Secondary | ICD-10-CM

## 2023-05-12 ENCOUNTER — Ambulatory Visit
Admission: RE | Admit: 2023-05-12 | Discharge: 2023-05-12 | Disposition: A | Discharge status: Home / Self Care | Payer: Medicare HMO | Source: Ambulatory Visit | Attending: Registered Nurse | Admitting: Registered Nurse

## 2023-05-12 DIAGNOSIS — K769 Liver disease, unspecified: Secondary | ICD-10-CM

## 2023-05-12 MED ORDER — GADOPICLENOL 0.5 MMOL/ML IV SOLN
6.0000 mL | Freq: Once | INTRAVENOUS | Status: AC | PRN
  Administered 2023-05-12: 6 mL via INTRAVENOUS

## 2023-06-01 ENCOUNTER — Ambulatory Visit: Payer: Medicare HMO | Admitting: Cardiology

## 2023-06-07 ENCOUNTER — Ambulatory Visit: Payer: Medicare HMO | Admitting: Cardiology

## 2023-06-07 ENCOUNTER — Encounter: Payer: Self-pay | Admitting: Cardiology

## 2023-06-07 VITALS — BP 112/69 | HR 76 | Resp 16 | Ht 62.0 in | Wt 137.4 lb

## 2023-06-07 DIAGNOSIS — I2510 Atherosclerotic heart disease of native coronary artery without angina pectoris: Secondary | ICD-10-CM

## 2023-06-07 DIAGNOSIS — R931 Abnormal findings on diagnostic imaging of heart and coronary circulation: Secondary | ICD-10-CM

## 2023-06-07 DIAGNOSIS — E781 Pure hyperglyceridemia: Secondary | ICD-10-CM

## 2023-06-07 DIAGNOSIS — E7800 Pure hypercholesterolemia, unspecified: Secondary | ICD-10-CM

## 2023-06-07 DIAGNOSIS — I700 Atherosclerosis of aorta: Secondary | ICD-10-CM

## 2023-06-07 MED ORDER — ROSUVASTATIN CALCIUM 40 MG PO TABS: 40.0000 mg | ORAL_TABLET | Freq: Every day | ORAL | Status: DC

## 2023-06-07 NOTE — Progress Notes (Signed)
ID:  Autumn Knight, DOB [birth year], MRN [id number]  PCP:  Merri Brunette, MD  Cardiologist:  Tessa Lerner, DO, [facility] (established care 03/03/2022)  Date: 06/07/23 Last Office Visit: 11/26/2022  Chief Complaint  Patient presents with   Coronary Artery Disease   Follow-up    HPI  Autumn Knight is a 72 y.o. Caucasian female whose past medical history and cardiovascular risk factors include: Moderate coronary calcification, hyperlipidemia, ulcerative colitis, endometrial cancer status post surgery and radiation, former smoker.   Referred to practice for evaluation of coronary artery calcification.  Workup illustrated a total CAC of 115, placing her at the 88th percentile.  Majority of the calcium was noted in the left main/LAD distribution.  She underwent nuclear stress test which was reported to be low risk.    She remains on statin therapy and at the last office visit fenofibrate was added and triglyceride levels have improved significantly as of February 2024.  She presents today for follow-up.  Denies anginal chest pain or heart failure symptoms.  Overall functional capacity remains stable.  FUNCTIONAL STATUS: Zumba couple days a week.  Enjoys doing water aerobic as well.  ALLERGIES: No Known Allergies  MEDICATION LIST PRIOR TO VISIT: Current Meds  Medication Sig   aspirin EC 81 MG tablet Take 81 mg by mouth daily. Swallow whole.   calcium carbonate (OS-CAL) 600 MG TABS Take 600 mg by mouth daily.   cholecalciferol (VITAMIN D) 1000 UNITS tablet Take 1,000 Units by mouth daily.   fenofibrate (TRICOR) 145 MG tablet TAKE 1 TABLET(145 MG) BY MOUTH DAILY   Lactobacillus (ACIDOPHILUS PO) Take by mouth. Probiotic   mesalamine (LIALDA) 1.2 g EC tablet Take 4 tablets by mouth at breakfast.   Multiple Vitamin (MULTIVITAMIN) tablet Take 1 tablet by mouth daily.   OVER THE COUNTER MEDICATION Biotin 10,000 mcg. One tablet daily.   Psyllium (FIBER) 0.52 G CAPS Take 2 capsules by mouth daily.    rosuvastatin (CRESTOR) 40 MG tablet Take 1 tablet (40 mg total) by mouth daily.   TURMERIC PO Take by mouth.   zinc gluconate 50 MG tablet Take by mouth.   [DISCONTINUED] rosuvastatin (CRESTOR) 20 MG tablet TAKE 1 TABLET(20 MG) BY MOUTH AT BEDTIME     PAST MEDICAL HISTORY: Past Medical History:  Diagnosis Date   Endometrial cancer (HCC)    History of colon polyps    Hyperlipidemia    Osteopenia    Other and unspecified noninfectious gastroenteritis and colitis(558.9)    Ulcerative colitis (HCC)     PAST SURGICAL HISTORY: Past Surgical History:  Procedure Laterality Date   ABDOMINAL HYSTERECTOMY     BREAST EXCISIONAL BIOPSY Left 1980's    FAMILY HISTORY: The patient family history includes ALS in her brother; Breast cancer (age of onset: 52) in her maternal grandmother; Cancer in her mother.  SOCIAL HISTORY:  The patient  reports that she quit smoking about 46 years ago. Her smoking use included cigarettes. She has never used smokeless tobacco. She reports current alcohol use. She reports that she does not use drugs.  REVIEW OF SYSTEMS: Review of Systems  Cardiovascular:  Negative for chest pain, claudication, dyspnea on exertion, leg swelling, near-syncope, orthopnea, palpitations, paroxysmal nocturnal dyspnea and syncope.    PHYSICAL EXAM:    06/07/2023    2:25 PM 11/26/2022   10:13 AM 09/28/2022    5:39 PM  Vitals with BMI  Height 5\' 2"  5\' 2"    Weight 137 lbs 6 oz 139 lbs 6  oz   BMI 25.12 25.49   Systolic 112 135 295  Diastolic 69 76 79  Pulse 76 77 64   Physical Exam  Constitutional: No distress.  Age appropriate, hemodynamically stable.   Neck: No JVD present.  Cardiovascular: Normal rate, regular rhythm, S1 normal, S2 normal, intact distal pulses and normal pulses. Exam reveals no gallop, no S3 and no S4.  No murmur heard. Pulses:      Dorsalis pedis pulses are 2+ on the right side and 2+ on the left side.       Posterior tibial pulses are 2+ on the right  side and 2+ on the left side.  Pulmonary/Chest: Effort normal and breath sounds normal. No stridor. She has no wheezes. She has no rales.  Abdominal: Soft. Bowel sounds are normal. She exhibits no distension. There is no abdominal tenderness.  Musculoskeletal:        General: No edema.     Cervical back: Neck supple.  Neurological: She is alert and oriented to person, place, and time. She has intact cranial nerves (2-12).  Skin: Skin is warm and moist.   CARDIAC DATABASE: EKG: 11/26/2022: Sinus rhythm, 66 bpm, without underlying ischemia or injury pattern.  Echocardiogram: 03/10/2022: Normal LV systolic function with visual EF 60-65%. Left ventricle cavity is normal in size. Normal left ventricular wall thickness. Normal global wall motion. Normal diastolic filling pattern, normal LAP.  Mild (Grade I) mitral regurgitation. No prior study for comparison.   Stress Testing: Exercise Myoview stress test 05/06/2022: Exercise nuclear stress test was performed using Bruce protocol.  1 Day Rest and Stress images. Exercise time 6 minutes 1 second, achieved 7.05 METS, 93%APMHR.  Stress ECG negative for ischemia. Normal myocardial perfusion without convincing evidence of reversible myocardial ischemia or prior infarct. Left ventricular size normal, wall motion preserved, calculated LVEF 73%. Low risk study.  Heart Catheterization: None  CCTA: 03/24/2022 1. Total coronary calcium score of 151. This was 80th percentile for age and sex matched control. 2. Normal coronary origin with right dominance. 3. CAD-RADS = 3. Left Main: Moderate stenosis (50-69%) closer to 50% due to eccentric calcified plaque at the distal left main that extends into the ostial LAD. LAD: Moderate stenosis (50-69%) visually closer to 50% due to eccentric calcified plaque at the ostial LAD (as discussed above). Moderate stenosis (50-69%) due to eccentric mixed plaque at proximal/mid LAD at the takeoff of the first  diagonal branch. Mid to distal LAD is patent. First diagonal is large size, normal caliber, gives off superior and inferior branches and is overall patent appears to be LAD equivalent. Ramus: Patent. LCx: Patent. RCA: Patent. 4.  Aortic atherosclerosis. 5. Due to the degree of artifact present the study did not meet quality control standards for it to be sent for CT-FFR for further analysis.  LABORATORY DATA: Lipid Panel   2021-11-10    Cholesterol 207   <200  Cholesterol / HDL Ratio 2.76   0.00-4.44  HDL Cholesterol 75   >39  LDL Cholesterol (Calculation) 102   <130  LDL/HDL Ratio 1.4   <3.3  Non-HDL Cholesterol 132   <130  Triglycerides 148   <150  TSH   2021-11-10    TSH 1.22   0.27-4.20  Vitamin D 25-Hydroxy   2021-11-10    Vitamin D 25-Hydroxy 33.1   30.0-100.0  CBC With Platelet And Differential RS In-house   2021-11-10    Absolute Basophils 0.0   0.0-0.1  Absolute Eosinophils 0.1   0.0-0.6  Absolute Lymphocytes 1.2   0.9-3.6  Absolute Monocytes 0.4   0.3-1.0  Absolute Neutrophils 2.6   2.0-8.2  Basophils Automated 0.5   0.0-2.0  Eosinophils Automated 3.2   0.0-7.0  Hematocrit 37.7   37.0-47.0  Hemoglobin 12.2   11.5-15.0  Lymphocytes Automated 28.1   20.5-51.1  MCH 31.1   26.0-33.0  MCHC 32.4   32.0-36.0  MCV 96.2   82.0-101.0  Monocytes Automated 8.4   5.0-12.0  Neutrophils Automated 59.8   42.2-75.2  Platelet Count 210   140-400  RBC 3.92   4.20-5.40  RDW 42.1      WBC 4.4   4.5-11.0  Comprehensive Metabolic Panel RS In-house   2021-11-10    Albumin 3.7   3.4-5.0  Albumin/Globulin Ratio 1.2   1.1-2.5  Alkaline Phosphatase 47   25-150  ALT (SGPT) 33   <6-78  AST (SGOT) 27   0-40  Bilirubin, Total 0.5   0.2-1.0  BUN 14   7-18  BUN/Creatinine Ratio 18.2   11.0-26.0  Calcium 8.9   8.5-10.1  Chloride 105   98-107  CO2 25   21-32  Creatinine 0.77   0.55-1.02  GFR/Black 91   >59  GFR/White 79   >59  Globulin, Calculated 3.1   1.5-4.6  Glucose 94   74-106   Potassium 4.1   3.5-5.1  Protein 6.8   6.4-8.2  Sodium 139   136-145   External Labs: Collected: 05/18/2022 provided by primary care Total cholesterol 202, triglycerides 199, HDL 73, LDL 89, non-HDL 129 Sodium 139, potassium 4.5, chloride 103, bicarb 26, BUN 15, creatinine 0.85. AST 31, ALT 51, alkaline phosphatase 50  External Labs: Collected: 11/18/2022 available in Care Everywhere. Total cholesterol 204, triglycerides 186, HDL 83, LDL 84, non-HDL 121. Hemoglobin 12.8, hematocrit 39.8%. Sodium 137, potassium 3.9, chloride 101, bicarb 27 BUN 13, creatinine 0.78. AST 39, ALT 51, alkaline phosphatase 53  RESULTS Component Value Reference Range Notes  Comprehensive Metabolic Panel (CMP) Reviewed EAVW:09/81/1914 10:11:46 AM Interpretation: Performing NWG:[facility] [place], [address] [place], [place] [id number], Phone - [phone], Director - MDNagendra Notes/Report:  Glucose 93 70-99 mg/dL    BUN 13 4-01 mg/dL    Creatinine 0.27 2.53-6.64 mg/dL    eGFR 65 >40 HK/VQQ/5.95    BUN/Creatinine Ratio 14 12-28    Sodium 140 134-144 mmol/L    Potassium 4.3 3.5-5.2 mmol/L    Chloride 106 96-106 mmol/L    Carbon Dioxide, Total 20 20-29 mmol/L    Calcium 9.6 8.7-10.3 mg/dL    Protein, Total 6.7 6.3-8.7 g/dL    Albumin 4.5 5.6-4.3 g/dL    Globulin, Total 2.2 1.5-4.5 g/dL    Bilirubin, Total 0.3 0.0-1.2 mg/dL    Alkaline Phosphatase 36 44-121 IU/L    AST (SGOT) 22 0-40 IU/L    ALT (SGPT) 16 0-32 IU/L    Lipid Panel Reviewed date:06/01/2023 10:11:46 AM Interpretation: Performing PIR:[facility] [place], [address], [place], [place] [id number], Phone - [phone], Director - MDNagendra Notes/Report:  Cholesterol, Total 170 100-199 mg/dL    Triglycerides 202 5-427 mg/dL    HDL Cholesterol 73 >06 mg/dL    VLDL Cholesterol Cal 20 5-40 mg/dL    LDL Chol Calc (NIH) 77 0-99 mg/dL     IMPRESSION:    [id number]   1. Coronary atherosclerosis due to calcified coronary lesion of  native artery  I25.10 rosuvastatin (CRESTOR) 40 MG tablet   I25.84 Lipid Panel With LDL/HDL Ratio    LDL cholesterol, direct    CMP14+EGFR  CMP14+EGFR    LDL cholesterol, direct    Lipid Panel With LDL/HDL Ratio    Lipoprotein A (LPA)    2. Total CAC 151, 80th percentile  R93.1 rosuvastatin (CRESTOR) 40 MG tablet    Lipid Panel With LDL/HDL Ratio    LDL cholesterol, direct    CMP14+EGFR    CMP14+EGFR    LDL cholesterol, direct    Lipid Panel With LDL/HDL Ratio    3. Hypertriglyceridemia  E78.1 Lipoprotein A (LPA)    4. Pure hypercholesterolemia  E78.00 rosuvastatin (CRESTOR) 40 MG tablet    Lipid Panel With LDL/HDL Ratio    LDL cholesterol, direct    CMP14+EGFR    CMP14+EGFR    LDL cholesterol, direct    Lipid Panel With LDL/HDL Ratio    5. Atherosclerosis of aorta (HCC)  I70.0         RECOMMENDATIONS: Autumn Knight is a 72 y.o. Caucasian female whose past medical history and cardiac risk factors include: Moderate coronary calcification, hyperlipidemia, ulcerative colitis, endometrial cancer status post surgery and radiation, former smoker.   Coronary atherosclerosis due to calcified coronary lesion of native artery Aortic atherosclerosis. Denies angina pectoris. Continue aspirin and statin therapy. Exercise MPI negative for reversible ischemia overall low risk study. No additional cardiovascular testing warranted at this time. Outside labs independently reviewed. Majority of the disease is in the left main and LAD distribution and he CAC places her at greater than 75th percentile for age matched cohorts. Discussed uptitration of statin therapy to target a goal LDL <55 mg/dL.  Patient is agreeable. Will increase rosuvastatin to 40 mg p.o. nightly with labs in 6 weeks to reevaluate therapy. Outside labs independently reviewed as noted above.  Pure hypercholesterolemia Hypertriglyceridemia Currently on rosuvastatin.   Uptitrate statin therapy as discussed  above. Triglycerides currently at goal. She does not endorse evidence of myalgias or other side effects. Recommend a goal LDL <55 mg/dL.  FINAL MEDICATION LIST END OF ENCOUNTER: Meds ordered this encounter  Medications   rosuvastatin (CRESTOR) 40 MG tablet    Sig: Take 1 tablet (40 mg total) by mouth daily.    Medications Discontinued During This Encounter  Medication Reason   rosuvastatin (CRESTOR) 20 MG tablet Dose change      Current Outpatient Medications:    aspirin EC 81 MG tablet, Take 81 mg by mouth daily. Swallow whole., Disp: , Rfl:    calcium carbonate (OS-CAL) 600 MG TABS, Take 600 mg by mouth daily., Disp: , Rfl:    cholecalciferol (VITAMIN D) 1000 UNITS tablet, Take 1,000 Units by mouth daily., Disp: , Rfl:    fenofibrate (TRICOR) 145 MG tablet, TAKE 1 TABLET(145 MG) BY MOUTH DAILY, Disp: 90 tablet, Rfl: 1   Lactobacillus (ACIDOPHILUS PO), Take by mouth. Probiotic, Disp: , Rfl:    mesalamine (LIALDA) 1.2 g EC tablet, Take 4 tablets by mouth at breakfast., Disp: 360 tablet, Rfl: 4   Multiple Vitamin (MULTIVITAMIN) tablet, Take 1 tablet by mouth daily., Disp: , Rfl:    OVER THE COUNTER MEDICATION, Biotin 10,000 mcg. One tablet daily., Disp: , Rfl:    Psyllium (FIBER) 0.52 G CAPS, Take 2 capsules by mouth daily., Disp: , Rfl:    rosuvastatin (CRESTOR) 40 MG tablet, Take 1 tablet (40 mg total) by mouth daily., Disp: , Rfl:    TURMERIC PO, Take by mouth., Disp: , Rfl:    zinc gluconate 50 MG tablet, Take by mouth., Disp: , Rfl:    COVID-19 mRNA vaccine 2023-2024 (COMIRNATY)  syringe, Inject into the muscle., Disp: 0.3 mL, Rfl: 0  Orders Placed This Encounter  Procedures   Lipid Panel With LDL/HDL Ratio   LDL cholesterol, direct   [id number]   There are no Patient Instructions on file for this visit.   --Continue cardiac medications as reconciled in final medication list. --Return in about 6 months (around 12/08/2023) for Follow up, Coronary artery calcification, Lipid.  Or sooner if needed. --Continue follow-up with your primary care physician regarding the management of your other chronic comorbid conditions.  Patient's questions and concerns were addressed to her satisfaction. She voices understanding of the instructions provided during this encounter.   This note was created using a voice recognition software as a result there may be grammatical errors inadvertently enclosed that do not reflect the nature of this encounter. Every attempt is made to correct such errors.  Tessa Lerner, [place], [facility]  Pager:  [phone] Office: [phone]

## 2023-06-17 ENCOUNTER — Ambulatory Visit
Admission: RE | Admit: 2023-06-17 | Discharge: 2023-06-17 | Disposition: A | Discharge status: Home / Self Care | Payer: Medicare HMO | Source: Ambulatory Visit | Attending: Internal Medicine | Admitting: Internal Medicine

## 2023-06-17 DIAGNOSIS — Z1231 Encounter for screening mammogram for malignant neoplasm of breast: Secondary | ICD-10-CM

## 2023-07-07 ENCOUNTER — Other Ambulatory Visit: Payer: Self-pay

## 2023-07-07 ENCOUNTER — Other Ambulatory Visit: Payer: Self-pay | Admitting: Cardiology

## 2023-07-07 DIAGNOSIS — E7800 Pure hypercholesterolemia, unspecified: Secondary | ICD-10-CM

## 2023-07-07 DIAGNOSIS — I2510 Atherosclerotic heart disease of native coronary artery without angina pectoris: Secondary | ICD-10-CM

## 2023-07-07 DIAGNOSIS — R931 Abnormal findings on diagnostic imaging of heart and coronary circulation: Secondary | ICD-10-CM

## 2023-07-07 MED ORDER — ROSUVASTATIN CALCIUM 40 MG PO TABS: 40.0000 mg | ORAL_TABLET | Freq: Every day | ORAL | Status: DC

## 2023-07-08 ENCOUNTER — Other Ambulatory Visit: Payer: Self-pay

## 2023-07-08 DIAGNOSIS — R931 Abnormal findings on diagnostic imaging of heart and coronary circulation: Secondary | ICD-10-CM

## 2023-07-08 DIAGNOSIS — E7800 Pure hypercholesterolemia, unspecified: Secondary | ICD-10-CM

## 2023-07-08 DIAGNOSIS — I2510 Atherosclerotic heart disease of native coronary artery without angina pectoris: Secondary | ICD-10-CM

## 2023-07-08 MED ORDER — ROSUVASTATIN CALCIUM 40 MG PO TABS
40.0000 mg | ORAL_TABLET | Freq: Every day | ORAL | 1 refills | Status: DC
Start: 2023-07-08 — End: 2023-12-30

## 2023-07-10 LAB — CMP14+EGFR: Creatinine, Ser: 0.98 mg/dL (ref 0.57–1.00)

## 2023-07-13 NOTE — Progress Notes (Signed)
Called patient to inform her about her lab results. Patient understood

## 2023-08-10 ENCOUNTER — Other Ambulatory Visit: Payer: Self-pay | Admitting: Cardiology

## 2023-08-10 DIAGNOSIS — I2510 Atherosclerotic heart disease of native coronary artery without angina pectoris: Secondary | ICD-10-CM

## 2023-08-10 DIAGNOSIS — E781 Pure hyperglyceridemia: Secondary | ICD-10-CM

## 2023-11-20 LAB — LAB REPORT - SCANNED: EGFR: 72

## 2023-11-25 ENCOUNTER — Other Ambulatory Visit: Payer: Self-pay | Admitting: Physician Assistant

## 2023-12-29 ENCOUNTER — Other Ambulatory Visit: Payer: Self-pay | Admitting: Cardiology

## 2023-12-29 DIAGNOSIS — I2510 Atherosclerotic heart disease of native coronary artery without angina pectoris: Secondary | ICD-10-CM

## 2023-12-29 DIAGNOSIS — E7800 Pure hypercholesterolemia, unspecified: Secondary | ICD-10-CM

## 2023-12-29 DIAGNOSIS — R931 Abnormal findings on diagnostic imaging of heart and coronary circulation: Secondary | ICD-10-CM

## 2024-02-02 ENCOUNTER — Other Ambulatory Visit: Payer: Self-pay | Admitting: Cardiology

## 2024-02-02 DIAGNOSIS — E781 Pure hyperglyceridemia: Secondary | ICD-10-CM

## 2024-02-02 DIAGNOSIS — I2510 Atherosclerotic heart disease of native coronary artery without angina pectoris: Secondary | ICD-10-CM

## 2024-02-20 ENCOUNTER — Other Ambulatory Visit: Payer: Self-pay | Admitting: Physician Assistant

## 2024-04-28 ENCOUNTER — Other Ambulatory Visit: Payer: Self-pay | Admitting: Cardiology

## 2024-04-28 DIAGNOSIS — I2510 Atherosclerotic heart disease of native coronary artery without angina pectoris: Secondary | ICD-10-CM

## 2024-04-28 DIAGNOSIS — E781 Pure hyperglyceridemia: Secondary | ICD-10-CM

## 2024-05-03 ENCOUNTER — Other Ambulatory Visit: Payer: Self-pay | Admitting: Internal Medicine

## 2024-05-03 DIAGNOSIS — Z1231 Encounter for screening mammogram for malignant neoplasm of breast: Secondary | ICD-10-CM

## 2024-05-26 ENCOUNTER — Other Ambulatory Visit: Payer: Self-pay | Admitting: Gastroenterology

## 2024-05-26 LAB — LAB REPORT - SCANNED: EGFR: 68

## 2024-05-30 ENCOUNTER — Ambulatory Visit: Payer: Self-pay | Admitting: Cardiology

## 2024-06-02 ENCOUNTER — Ambulatory Visit: Payer: Self-pay | Admitting: Cardiology

## 2024-06-21 ENCOUNTER — Encounter: Payer: Self-pay | Admitting: Cardiology

## 2024-06-21 ENCOUNTER — Ambulatory Visit: Attending: Cardiology | Admitting: Cardiology

## 2024-06-21 VITALS — BP 122/68 | HR 75 | Resp 16 | Ht 62.0 in | Wt 136.0 lb

## 2024-06-21 DIAGNOSIS — I700 Atherosclerosis of aorta: Secondary | ICD-10-CM

## 2024-06-21 DIAGNOSIS — I2510 Atherosclerotic heart disease of native coronary artery without angina pectoris: Secondary | ICD-10-CM

## 2024-06-21 DIAGNOSIS — I2584 Coronary atherosclerosis due to calcified coronary lesion: Secondary | ICD-10-CM

## 2024-06-21 DIAGNOSIS — E781 Pure hyperglyceridemia: Secondary | ICD-10-CM | POA: Diagnosis not present

## 2024-06-21 DIAGNOSIS — E7800 Pure hypercholesterolemia, unspecified: Secondary | ICD-10-CM

## 2024-06-21 DIAGNOSIS — E7841 Elevated Lipoprotein(a): Secondary | ICD-10-CM

## 2024-06-21 NOTE — Progress Notes (Signed)
 Cardiology Office Note:  .   Date:  06/21/2024  ID:  Autumn Knight, DOB 1951/06/19, MRN [id number] PCP:  Clarice Nottingham, MD  Former Cardiology Providers: NA [facility] Providers Cardiologist:  Madonna Large, DO , [facility] (established care 03/03/2022) Electrophysiologist:  None  Click to update primary MD,subspecialty MD or APP then REFRESH:1}    Chief Complaint  Patient presents with   Coronary atherosclerosis due to calcified coronary lesion o   Follow-up    History of Present Illness: .   Autumn Knight is a 73 y.o. Caucasian female whose past medical history and cardiovascular risk factors includes: Moderate coronary calcification, hyperlipidemia, ulcerative colitis, endometrial cancer status post surgery and radiation, former smoker.   Referred to the practice for evaluation and management of coronary artery calcification.  She has noted to have moderate coronary artery calcification placing her at the 88th percentile.  Majority of the disease burden was noted to be in the left main/LAD.  She underwent nuclear stress test which was reported to be low risk.  Since then we have been focusing on secondary prevention especially lipid management.  Patient was also started on fenofibrate  which has significantly helped her triglyceride levels.  She was last seen in the office July 15 to 2024 and presents today for 1 year follow-up visit.  Since last office visit patient denies any anginal chest pain or heart failure symptoms.  No hospitalizations or urgent care visits for cardiovascular reasons.  She has been compliant with her medical therapy.  Overall physical endurance remains relatively stable. Labs provided by PCP from July 2025 independently reviewed.  Review of Systems: .   Review of Systems  Cardiovascular:  Negative for chest pain, claudication, irregular heartbeat, leg swelling, near-syncope, orthopnea, palpitations, paroxysmal nocturnal dyspnea and syncope.  Respiratory:   Negative for shortness of breath.   Hematologic/Lymphatic: Negative for bleeding problem.    Studies Reviewed:   EKG: EKG Interpretation Date/Time:  Wednesday June 21 2024 09:19:32 EDT Ventricular Rate:  70 PR Interval:  158 QRS Duration:  84 QT Interval:  386 QTC Calculation: 416 R Axis:   36  Text Interpretation: Normal sinus rhythm Normal ECG No previous ECGs available Confirmed by Large Madonna [phone]) on 06/21/2024 9:24:04 AM  Echocardiogram: 03/10/2022: Normal LV systolic function with visual EF 60-65%. Left ventricle cavity is normal in size. Normal left ventricular wall thickness. Normal global wall motion. Normal diastolic filling pattern, normal LAP.  Mild (Grade I) mitral regurgitation. No prior study for comparison.    Stress Testing: Exercise Myoview stress test 05/06/2022: Exercise time 6 minutes 1 second, achieved 7.05 METS, 93%APMHR.  Stress ECG negative for ischemia. Normal myocardial perfusion without convincing evidence of reversible myocardial ischemia or prior infarct. Left ventricular size normal, wall motion preserved, calculated LVEF 73%. Low risk study.  CCTA: 03/24/2022 1. Total coronary calcium  score of 151. This was 80th percentile for age and sex matched control. 2. Normal coronary origin with right dominance. 3. CAD-RADS = 3. Left Main: Moderate stenosis (50-69%) closer to 50% due to eccentric calcified plaque at the distal left main that extends into the ostial LAD. LAD: Moderate stenosis (50-69%) visually closer to 50% due to eccentric calcified plaque at the ostial LAD (as discussed above). Moderate stenosis (50-69%) due to eccentric mixed plaque at proximal/mid LAD at the takeoff of the first diagonal branch. Mid to distal LAD is patent. First diagonal is large size, normal caliber, gives off superior and inferior branches and is overall patent appears  to be LAD equivalent. Ramus: Patent. LCx: Patent. RCA: Patent. 4.  Aortic  atherosclerosis. 5. Due to the degree of artifact present the study did not meet quality control standards for it to be sent for CT-FFR for further analysis.  RADIOLOGY: NA  Risk Assessment/Calculations:   NA   Labs:       Latest Ref Rng & Units 09/08/2022    3:57 PM  CBC  WBC 4.0 - 10.5 K/uL 6.0   Hemoglobin 12.0 - 15.0 g/dL 87.5   Hematocrit 63.9 - 46.0 % 37.2   Platelets 150.0 - 400.0 K/uL 222.0        Latest Ref Rng & Units 07/09/2023    8:25 AM 01/07/2023    8:28 AM 03/11/2022    1:30 PM  BMP  Glucose 70 - 99 mg/dL 93  91  881   BUN 8 - 27 mg/dL 16  16  16    Creatinine 0.57 - 1.00 mg/dL 9.01  9.02  9.10   BUN/Creat Ratio 12 - 28 16  16  18    Sodium 134 - 144 mmol/L 139  140  142   Potassium 3.5 - 5.2 mmol/L 4.1  4.2  4.4   Chloride 96 - 106 mmol/L 102  103  105   CO2 20 - 29 mmol/L 23  23  24    Calcium  8.7 - 10.3 mg/dL 89.7  9.6  9.8       Latest Ref Rng & Units 07/09/2023    8:25 AM 01/07/2023    8:28 AM 03/11/2022    1:30 PM  CMP  Glucose 70 - 99 mg/dL 93  91  881   BUN 8 - 27 mg/dL 16  16  16    Creatinine 0.57 - 1.00 mg/dL 9.01  9.02  9.10   Sodium 134 - 144 mmol/L 139  140  142   Potassium 3.5 - 5.2 mmol/L 4.1  4.2  4.4   Chloride 96 - 106 mmol/L 102  103  105   CO2 20 - 29 mmol/L 23  23  24    Calcium  8.7 - 10.3 mg/dL 89.7  9.6  9.8   Total Protein 6.0 - 8.5 g/dL 7.1  7.1    Total Bilirubin 0.0 - 1.2 mg/dL 0.3  0.4    Alkaline Phos 44 - 121 IU/L 39  43    AST 0 - 40 IU/L 29  21    ALT 0 - 32 IU/L 21  16      Lab Results  Component Value Date   CHOL 183 07/09/2023   HDL 79 07/09/2023   LDLCALC 85 07/09/2023   LDLDIRECT 81 07/09/2023   TRIG 106 07/09/2023   Recent Labs    07/09/23 0818  LIPOA 251.0*   No components found for: NTPROBNP No results for input(s): PROBNP in the last 8760 hours. No results for input(s): TSH in the last 8760 hours.  External Labs: Collected: May 25, 2024 provided PCP. Total cholesterol 177, triglycerides  87, HDL 75, LDL calculated 86 BUN 13, creatinine 0.9. eGFR 68. Sodium 40, potassium 4.3, chloride 105, bicarb 23. AST, ALT within normal limits  Physical Exam:    Today's Vitals   06/21/24 0910  BP: 122/68  Pulse: 75  Resp: 16  SpO2: 95%  Weight: 136 lb (61.7 kg)  Height: 5' 2 (1.575 m)   Body mass index is 24.87 kg/m. Wt Readings from Last 3 Encounters:  06/21/24 136 lb (61.7 kg)  06/07/23 137 lb 6.4  oz (62.3 kg)  11/26/22 139 lb 6.4 oz (63.2 kg)    Physical Exam  Constitutional: No distress.  Age appropriate, hemodynamically stable.   Neck: No JVD present.  Cardiovascular: Normal rate, regular rhythm, S1 normal, S2 normal, intact distal pulses and normal pulses. Exam reveals no gallop, no S3 and no S4.  No murmur heard. Pulses:      Dorsalis pedis pulses are 2+ on the right side and 2+ on the left side.       Posterior tibial pulses are 2+ on the right side and 2+ on the left side.  Pulmonary/Chest: Effort normal and breath sounds normal. No stridor. She has no wheezes. She has no rales.  Abdominal: Soft. Bowel sounds are normal. She exhibits no distension. There is no abdominal tenderness.  Musculoskeletal:        General: No edema.     Cervical back: Neck supple.  Neurological: She is alert and oriented to person, place, and time. She has intact cranial nerves (2-12).  Skin: Skin is warm and moist.     Impression & Recommendation(s):  Impression:   ICD-10-CM   1. Coronary atherosclerosis due to calcified coronary lesion of native artery  I25.10 EKG 12-Lead   I25.84 Lipoprotein A (LPA)    Lipid panel    Comprehensive metabolic panel with GFR    AMB Referral to [facility] Pharm-D    Comprehensive metabolic panel with GFR    Lipid panel    Lipoprotein A (LPA)    2. Atherosclerosis of aorta (HCC)  I70.0     3. Pure hypercholesterolemia  E78.00 Lipoprotein A (LPA)    Lipid panel    Comprehensive metabolic panel with GFR    Comprehensive metabolic panel with  GFR    Lipid panel    Lipoprotein A (LPA)    4. Hypertriglyceridemia  E78.1 Lipoprotein A (LPA)    Lipid panel    Comprehensive metabolic panel with GFR    Comprehensive metabolic panel with GFR    Lipid panel    Lipoprotein A (LPA)    5. Elevated Lp(a)  E78.41 Lipoprotein A (LPA)    AMB Referral to [facility]    Lipoprotein A (LPA)       Recommendation(s):  Coronary atherosclerosis due to calcified coronary lesion of native artery Atherosclerosis of aorta (HCC) Total CAC 151, 80th percentile, moderate nonobstructive coronary artery disease Denies anginal chest pain or heart failure symptoms. EKG is nonischemic. Focused on improving her modifiable cardiovascular risk factors and she has been aggressively and cementing lifestyle changes and also with medication compliance lipids have improved significantly. No change in physical endurance Last echo and coronary CTA results reviewed as part of today's visit.  Pure hypercholesterolemia Hypertriglyceridemia Elevated Lp(a) Recently had fasting lipids with PCP in July 2025 and LDL levels are at 86 mg/dL and triglycerides are 87 mg/dL on current medical therapy. Unfortunately, her prior LP(a) levels were 251 with laboratory normal values being less than 75 nmol/L) Given her moderate coronary artery calcification and nonobstructive CAD recommended considering PCSK9 inhibitors to further optimize her lipid profile.  Would recommend a goal LDL at least less than 70 mg/dL and if possible close to 55 mg/dL she is already on maximal tolerated dose of statin therapy. Will refer her to Pharm.D. clinic for Repatha initiation. If and when Repatha does get approved recommend checking fasting lipids at which time we will also reevaluate LP(a).  When she starts Repatha recommend reducing Crestor  to 20 mg p.o. nightly  and holding fenofibrate . Patient agreeable with the plan of care.  Orders Placed:  Orders Placed This Encounter  Procedures    Lipoprotein A (LPA)    Standing Status:   Future    Number of Occurrences:   1    Expected Date:   06/28/2024    Expiration Date:   06/21/2025   Lipid panel    Standing Status:   Future    Number of Occurrences:   1    Expected Date:   06/28/2024    Expiration Date:   06/21/2025   Comprehensive metabolic panel with GFR    Standing Status:   Future    Number of Occurrences:   1    Expected Date:   06/28/2024    Expiration Date:   06/21/2025   AMB Referral to [facility] Pharm-D    Referral Priority:   Routine    Referral Type:   Consultation    Referral Reason:   Specialty Services Required    Number of Visits Requested:   1   EKG 12-Lead     Final Medication List:   No orders of the defined types were placed in this encounter.   There are no discontinued medications.   Current Outpatient Medications:    aspirin  EC 81 MG tablet, Take 81 mg by mouth daily. Swallow whole., Disp: , Rfl:    Biotin 89999 MCG TABS, Take 1 tablet by mouth daily., Disp: , Rfl:    calcium  carbonate (OS-CAL) 600 MG TABS, Take 600 mg by mouth daily., Disp: , Rfl:    cholecalciferol (VITAMIN D) 1000 UNITS tablet, Take 1,000 Units by mouth daily., Disp: , Rfl:    fenofibrate  (TRICOR ) 145 MG tablet, TAKE 1 TABLET(145 MG) BY MOUTH DAILY, Disp: 90 tablet, Rfl: 0   Lactobacillus (ACIDOPHILUS PO), Take by mouth. Probiotic, Disp: , Rfl:    mesalamine  (LIALDA ) 1.2 g EC tablet, Take 4 tablets by mouth at breakfast. Patient needs follow up appointment for future refills. Please call [phone] to schedule an appointment., Disp: 120 tablet, Rfl: 0   Multiple Vitamin (MULTIVITAMIN) tablet, Take 1 tablet by mouth daily., Disp: , Rfl:    OVER THE COUNTER MEDICATION, Biotin 10,000 mcg. One tablet daily., Disp: , Rfl:    Psyllium (FIBER) 0.52 G CAPS, Take 2 capsules by mouth daily., Disp: , Rfl:    rosuvastatin  (CRESTOR ) 40 MG tablet, TAKE 1 TABLET(40 MG) BY MOUTH DAILY, Disp: 90 tablet, Rfl: 1   TURMERIC PO, Take by mouth.,  Disp: , Rfl:    zinc gluconate 50 MG tablet, Take by mouth., Disp: , Rfl:    COVID-19 mRNA vaccine 2023-2024 (COMIRNATY ) syringe, Inject into the muscle., Disp: 0.3 mL, Rfl: 0  Consent:   NA  Disposition:   6 months sooner if needed  Her questions and concerns were addressed to her satisfaction. She voices understanding of the recommendations provided during this encounter.    Signed, Madonna Michele HAS, [facility]  A Division of [facility] [address], [place], [place] [address]  [place], [place] [address] 06/21/2024 11:09 AM

## 2024-06-21 NOTE — Patient Instructions (Signed)
 Medication Instructions:  No medication changes were made at this visit. Continue current regimen.   *If you need a refill on your cardiac medications before your next appointment, please call your pharmacy*  Lab Work: To be completed IF APPROVED for Repatha: FASTING lipid panel, CMP, Lipoprotein-A  If you have labs (blood work) drawn today and your tests are completely normal, you will receive your results only by: MyChart Message (if you have MyChart) OR A paper copy in the mail If you have any lab test that is abnormal or we need to change your treatment, we will call you to review the results.  Testing/Procedures: None ordered today.  Follow-Up: At [facility], you and your health needs are our priority.  As part of our continuing mission to provide you with exceptional heart care, our providers are all part of one team.  This team includes your primary Cardiologist (physician) and Advanced Practice Providers or APPs (Physician Assistants and Nurse Practitioners) who all work together to provide you with the care you need, when you need it.  Your next appointment:   6 month(s)  Provider:   Madonna Large, DO    We recommend signing up for the patient portal called MyChart.  Sign up information is provided on this After Visit Summary.  MyChart is used to connect with patients for Virtual Visits (Telemedicine).  Patients are able to view lab/test results, encounter notes, upcoming appointments, etc.  Non-urgent messages can be sent to your provider as well.   To learn more about what you can do with MyChart, go to [email or web address].   Other Instructions You have been referred to PharmD for lipid management/ initiation of Repatha.    ONLY get labs completed IF APPROVED for Repatha.

## 2024-06-23 ENCOUNTER — Other Ambulatory Visit: Payer: Self-pay | Admitting: Cardiology

## 2024-06-23 DIAGNOSIS — R931 Abnormal findings on diagnostic imaging of heart and coronary circulation: Secondary | ICD-10-CM

## 2024-06-23 DIAGNOSIS — I2510 Atherosclerotic heart disease of native coronary artery without angina pectoris: Secondary | ICD-10-CM

## 2024-06-23 DIAGNOSIS — E7800 Pure hypercholesterolemia, unspecified: Secondary | ICD-10-CM

## 2024-06-24 ENCOUNTER — Other Ambulatory Visit: Payer: Self-pay | Admitting: Gastroenterology

## 2024-06-29 ENCOUNTER — Ambulatory Visit
Admission: RE | Admit: 2024-06-29 | Discharge: 2024-06-29 | Disposition: A | Discharge status: Home / Self Care | Source: Ambulatory Visit | Attending: Internal Medicine | Admitting: Internal Medicine

## 2024-06-29 DIAGNOSIS — Z1231 Encounter for screening mammogram for malignant neoplasm of breast: Secondary | ICD-10-CM

## 2024-07-03 ENCOUNTER — Telehealth: Payer: Self-pay | Admitting: Cardiology

## 2024-07-03 DIAGNOSIS — R931 Abnormal findings on diagnostic imaging of heart and coronary circulation: Secondary | ICD-10-CM

## 2024-07-03 DIAGNOSIS — I2510 Atherosclerotic heart disease of native coronary artery without angina pectoris: Secondary | ICD-10-CM

## 2024-07-03 DIAGNOSIS — E7800 Pure hypercholesterolemia, unspecified: Secondary | ICD-10-CM

## 2024-07-03 MED ORDER — ROSUVASTATIN CALCIUM 40 MG PO TABS: 40.0000 mg | ORAL_TABLET | Freq: Every day | ORAL | 3 refills | Status: AC

## 2024-07-03 NOTE — Telephone Encounter (Signed)
 RX sent in

## 2024-07-03 NOTE — Telephone Encounter (Signed)
*  STAT* If patient is at the pharmacy, call can be transferred to refill team.   1. Which medications need to be refilled? (please list name of each medication and dose if known) rosuvastatin  (CRESTOR ) 40 MG tablet   2. Which pharmacy/location (including street and city if local pharmacy) is medication to be sent to?  [id number] - [place], [place] - [address] AT [facility] [address]      3. Do they need a 30 day or 90 day supply? 30 day   Pt is leaving to go out of town Friday and will run out while being out of town.

## 2024-07-04 ENCOUNTER — Other Ambulatory Visit: Payer: Self-pay | Admitting: Internal Medicine

## 2024-07-04 DIAGNOSIS — R928 Other abnormal and inconclusive findings on diagnostic imaging of breast: Secondary | ICD-10-CM

## 2024-07-05 ENCOUNTER — Ambulatory Visit: Payer: Self-pay | Admitting: Cardiology

## 2024-07-05 LAB — COMPREHENSIVE METABOLIC PANEL WITH GFR
ALT: 19 IU/L (ref 0–32)
AST: 27 IU/L (ref 0–40)
Albumin: 4.6 g/dL (ref 3.8–4.8)
Alkaline Phosphatase: 37 IU/L — ABNORMAL LOW (ref 44–121)
BUN/Creatinine Ratio: 16 (ref 12–28)
BUN: 15 mg/dL (ref 8–27)
Bilirubin Total: 0.4 mg/dL (ref 0.0–1.2)
CO2: 18 mmol/L — ABNORMAL LOW (ref 20–29)
Calcium: 9.9 mg/dL (ref 8.7–10.3)
Chloride: 104 mmol/L (ref 96–106)
Creatinine, Ser: 0.92 mg/dL (ref 0.57–1.00)
Globulin, Total: 2.3 g/dL (ref 1.5–4.5)
Glucose: 91 mg/dL (ref 70–99)
Potassium: 4.5 mmol/L (ref 3.5–5.2)
Sodium: 141 mmol/L (ref 134–144)
Total Protein: 6.9 g/dL (ref 6.0–8.5)
eGFR: 67 mL/min/1.73 (ref >59)

## 2024-07-05 LAB — LIPOPROTEIN A (LPA): Lipoprotein (a): 247.6 nmol/L — ABNORMAL HIGH (ref <75.0)

## 2024-07-05 LAB — LIPID PANEL
Chol/HDL Ratio: 2.3 ratio (ref 0.0–4.4)
Cholesterol, Total: 179 mg/dL (ref 100–199)
HDL: 77 mg/dL (ref >39)
LDL Chol Calc (NIH): 83 mg/dL (ref 0–99)
Triglycerides: 109 mg/dL (ref 0–149)
VLDL Cholesterol Cal: 19 mg/dL (ref 5–40)

## 2024-07-06 ENCOUNTER — Other Ambulatory Visit (HOSPITAL_COMMUNITY): Payer: Self-pay

## 2024-07-06 ENCOUNTER — Ambulatory Visit

## 2024-07-06 ENCOUNTER — Ambulatory Visit
Admission: RE | Admit: 2024-07-06 | Discharge: 2024-07-06 | Disposition: A | Discharge status: Home / Self Care | Source: Ambulatory Visit | Attending: Internal Medicine | Admitting: Internal Medicine

## 2024-07-06 DIAGNOSIS — R928 Other abnormal and inconclusive findings on diagnostic imaging of breast: Secondary | ICD-10-CM

## 2024-07-06 NOTE — Telephone Encounter (Signed)
 Pharmacy Patient Advocate Encounter   Received notification from Physician's Office that prior authorization for Repatha is required/requested.   Insurance verification completed.   The patient is insured through [facility] .   Per test claim: PA required; PA submitted to above mentioned insurance via Latent Key/confirmation #/EOC [id number] Status is pending

## 2024-07-06 NOTE — Telephone Encounter (Signed)
 Reviewed.  See if PharmD can see her sooner to start PCSK9i or can they send in prior auth to see if its a covered?  Shar Paez [place], DO, FACC

## 2024-07-06 NOTE — Telephone Encounter (Signed)
 Pharmacy Patient Advocate Encounter  Received notification from [employer] that Prior Authorization for Repatha has been APPROVED from 07/06/24 to 01/06/25. Ran test claim, Copay is $21.00- one month. This test claim was processed through [facility]- copay amounts may vary at other pharmacies due to pharmacy/plan contracts, or as the patient moves through the different stages of their insurance plan.   PA #/Case ID/Reference #: [id number]

## 2024-08-01 ENCOUNTER — Ambulatory Visit: Attending: Cardiology | Admitting: Pharmacist

## 2024-08-01 ENCOUNTER — Other Ambulatory Visit (HOSPITAL_COMMUNITY): Payer: Self-pay

## 2024-08-01 ENCOUNTER — Encounter: Payer: Self-pay | Admitting: Pharmacist

## 2024-08-01 DIAGNOSIS — E7800 Pure hypercholesterolemia, unspecified: Secondary | ICD-10-CM

## 2024-08-01 DIAGNOSIS — E785 Hyperlipidemia, unspecified: Secondary | ICD-10-CM | POA: Insufficient documentation

## 2024-08-01 MED ORDER — REPATHA SURECLICK 140 MG/ML ~~LOC~~ SOAJ
140.0000 mg | SUBCUTANEOUS | 0 refills | Status: DC
  Filled 2024-08-01: qty 2, 28d supply, fill #0

## 2024-08-01 MED ORDER — REPATHA SURECLICK 140 MG/ML ~~LOC~~ SOAJ: 140.0000 mg | SUBCUTANEOUS | 3 refills | Status: AC

## 2024-08-01 NOTE — Progress Notes (Signed)
 Patient ID: Autumn Knight                 DOB: 05/30/1951                    MRN: [id number]      HPI: Autumn Knight is a 73 y.o. female patient referred to lipid clinic by Dr. Michele. PMH is significant for HLD, moderate coronary calcification, ulcerative colitis, and endometrial cancer post surgery and radiation.  Patient presents to lipid clinic. She is aware that her Lpa is elevated and Repatha  will be discussed today as an option. She reports that she received a notification that Repatha  PA may be initiated and approved. She states that she is tolerating rosuvastatin  and fenofibrate  well, She follows a healthy diet and is cognizant of good fats and bad fats. She engages in aerobic exercise twice a week, mostly walking and Zumba classes at [employer]  Reviewed options for lowering LDL cholesterol including PCSK-9 inhibitors. Discussed mechanisms of action, dosing, side effects and potential decreases in LDL cholesterol.    Current Medications: rosuvastatin  40 mg daily, fenofibrate  145 mg daily Intolerances: none Risk Factors: elevated Lpa, moderate coronary calcification  LDL goal: <70 (if possible close to 55) Most recent lipid panel (06/2024): Chol 179, Trig 109; HDL 77, LDL 83; lipoprotein (a): 247.6  Diet:  Consists of fiber, whole grain, vegetables  Exercise:  Walking and Zumba classes at [employer] and/or parks   Family History:   Relation Problem Comments  Mother (Deceased) Cancer Blood    Father (Deceased)   Sister Metallurgist)   Brother - andrew Metallurgist)   Brother (Deceased) ALS     Maternal Grandmother Breast cancer (Age: 40)     Neg Hx Colon cancer   Esophageal cancer   Pancreatic cancer   Rectal cancer   Stomach cancer       Social History:  Smoking: none; quit in 1978  Alcohol: none   Labs:  Lipid Panel     Component Value Date/Time   CHOL 179 07/04/2024 0820   TRIG 109 07/04/2024 0820   HDL 77 07/04/2024 0820   CHOLHDL 2.3 07/04/2024 0820    LDLCALC 83 07/04/2024 0820   LDLDIRECT 81 07/09/2023 0825   LABVLDL 19 07/04/2024 0820    Past Medical History:  Diagnosis Date   Endometrial cancer (HCC)    History of colon polyps    Hyperlipidemia    Osteopenia    Other and unspecified noninfectious gastroenteritis and colitis(558.9)    Ulcerative colitis (HCC)     Current Outpatient Medications on File Prior to Visit  Medication Sig Dispense Refill   aspirin  EC 81 MG tablet Take 81 mg by mouth daily. Swallow whole.     Biotin 89999 MCG TABS Take 1 tablet by mouth daily.     calcium  carbonate (OS-CAL) 600 MG TABS Take 600 mg by mouth daily.     cholecalciferol (VITAMIN D) 1000 UNITS tablet Take 1,000 Units by mouth daily.     fenofibrate  (TRICOR ) 145 MG tablet TAKE 1 TABLET(145 MG) BY MOUTH DAILY 90 tablet 0   Lactobacillus (ACIDOPHILUS PO) Take by mouth. Probiotic     mesalamine  (LIALDA ) 1.2 g EC tablet TAKE 4 TABLETS BY MOUTH AT BREAKFAST. NEED FOLLOW UP APPOINTMENT FOR FUTURE REFILLS. PLEASE 120 tablet 1   Multiple Vitamin (MULTIVITAMIN) tablet Take 1 tablet by mouth daily.     OVER THE COUNTER MEDICATION Biotin 10,000 mcg. One tablet daily.  Psyllium (FIBER) 0.52 G CAPS Take 2 capsules by mouth daily.     rosuvastatin  (CRESTOR ) 40 MG tablet Take 1 tablet (40 mg total) by mouth daily. 90 tablet 3   TURMERIC PO Take by mouth.     zinc gluconate 50 MG tablet Take by mouth.     No current facility-administered medications on file prior to visit.    No Known Allergies  Assessment/Plan:  1. Hyperlipidemia -  Problem  Hyperlipidemia   Current Medications: rosuvastatin  40 mg daily, fenofibrate  145 mg daily Intolerances: none Risk Factors: elevated Lpa, moderate coronary calcification  LDL goal: <70 (if possible close to 55) Most recent lipid panel (06/2024): Chol 179, Trig 109; HDL 77, LDL 83; lipoprotein (a): 247.6    Hyperlipidemia Assessment:  LDL goal: <70 mg/dl last LDLc 83 mg/dl (91/7974); Lpa 247.6 Tolerates  rosuvastatin  and fenofibrate  well without any side effects  Discussed potential options (PCSK-9 inhibitors); cost, dosing efficacy, side effects  Repatha  covered under insurance Reviewed lifestyle modifications to reduce LDL and triglycerides  Plan: Begin Repatha  140 mg subcutaneously every two weeks Stop Fenofibrate  Continue rosuvastatin  40 mg daily Repeat lipid panel and Lpa due in 2 months after starting PCSK9i  Expressed the importance of exercise and following a healthy, low fat diet in midst of stopping fenofibrate  Provided and discussed heart healthy diet handout May consider decreasing rosuvastatin  dose after assessment of next lipid panel    Thank you,  Autumn Knight, Pharm.D Autumn Knight Autumn Knight. [facility] [address], [place], [place] [address] Phone: [phone]; Fax: [phone]    Was present with Autumn Knight the visit and agree with assessment and plan   Autumn Knight, Pharm.D Autumn Knight Autumn Knight. [facility] [address], [place], [place] [address] Phone: [phone]; Fax: [phone]

## 2024-08-01 NOTE — Assessment & Plan Note (Addendum)
 Assessment:  LDL goal: <70 mg/dl last LDLc 83 mg/dl (91/7974); Lpa 247.6 Tolerates rosuvastatin  and fenofibrate  well without any side effects  Discussed potential options (PCSK-9 inhibitors); cost, dosing efficacy, side effects  Repatha  covered under insurance Reviewed lifestyle modifications to reduce LDL and triglycerides  Plan: Begin Repatha  140 mg subcutaneously every two weeks Stop Fenofibrate  Continue rosuvastatin  40 mg daily Repeat lipid panel and Lpa due in 2 months after starting PCSK9i  Expressed the importance of exercise and following a healthy, low fat diet in midst of stopping fenofibrate  Provided and discussed heart healthy diet handout May consider decreasing rosuvastatin  dose after assessment of next lipid panel

## 2024-08-01 NOTE — Patient Instructions (Addendum)
 Your Results:             Your most recent labs Goal  Total Cholesterol 179 < 200  Triglycerides 109 < 150  HDL (happy/good cholesterol) 77 > 40  LDL (lousy/bad cholesterol 83 < 83  Lpa 247 <75   Medication changes: Start Repatha  140 mg subcutaneously every 2 weeks  Continue rosuvastatin  40 mg daily  Stop fenofibrate  140 mg daily  Repatha  is a cholesterol medication that improved your body's ability to get rid of bad cholesterol known as LDL. It can lower your LDL up to 60%! It is an injection that is given under the skin every 2 weeks. The medication often requires a prior authorization from your insurance company. We will take care of submitting all the necessary information to your insurance company to get it approved. The most common side effects of Repatha  include runny nose, symptoms of the common cold, rarely flu or flu-like symptoms, back/muscle pain in about 3-4% of the patients, and redness, pain, or bruising at the injection site.  Lab orders: We want to repeat labs November 10th 2025

## 2024-08-02 ENCOUNTER — Other Ambulatory Visit (HOSPITAL_COMMUNITY): Payer: Self-pay

## 2024-08-03 ENCOUNTER — Ambulatory Visit: Admitting: Gastroenterology

## 2024-08-03 ENCOUNTER — Encounter: Payer: Self-pay | Admitting: Gastroenterology

## 2024-08-03 VITALS — BP 126/60 | HR 77 | Ht 62.0 in | Wt 135.1 lb

## 2024-08-03 DIAGNOSIS — D490 Neoplasm of unspecified behavior of digestive system: Secondary | ICD-10-CM | POA: Diagnosis not present

## 2024-08-03 DIAGNOSIS — Z860100 Personal history of colon polyps, unspecified: Secondary | ICD-10-CM

## 2024-08-03 DIAGNOSIS — K5100 Ulcerative (chronic) pancolitis without complications: Secondary | ICD-10-CM

## 2024-08-03 DIAGNOSIS — Z79899 Other long term (current) drug therapy: Secondary | ICD-10-CM

## 2024-08-03 NOTE — Progress Notes (Signed)
 Chief Complaint:    Medication refill, Ulcerative Colitis   HPI:     Patient is a 73 y.o. female with a history of uterine cancer  s/p hysterectomy and radiation, HLD, moderate coronary calcification, osteopenia, presenting to the [facility] for routine follow-up and requesting medication refill of Lialda .  Was last seen in the GI clinic on 09/03/2022 and continued on Lialda  4.8 g daily.  She has a longstanding history of pan ulcerative colitis, diagnosed in 1986, she established care with Dr. Teressa in 2013.  Colonoscopy was done in December 2020 with finding of evidence of prior colitis in the right colon with scarring but no inflammation and otherwise normal-appearing colon with the exception of external hemorrhoids.  Biopsies from the right colon showed no active inflammation and no dysplasia, biopsies from the left colon showed no active inflammation or dysplasia.  Follow-up was recommended in 3 years.  Last colonoscopy was 09/2022 and notable for 15 mm flat cecal polyp (pathology resulted as hyperplastic polyp, but clinically more consistent with SSP), scarred appearance in the ascending and proximal transverse colon with otherwise normal remainder of the colon, sigmoid diverticulosis, small internal hemorrhoids, normal ileum.  Biopsies taken throughout the colon were otherwise normal.  Recommended repeat in 3 years.  She has been maintained on Lialda  4.8 g daily, and says she has been doing well.  No breakthrough symptoms.   Reviewed CMP from 06/2024: CO2 18, otherwise normal.    I reviewed the 2 most recent MRI abdomen reports.  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  Radiologist recommended MRI/MRCP in 1 year.  Repeat MRI in 04/2023 with 2 cm cystic lesion with direct communication to  the main PD compatible with sidebranch IPMN, not appreciably changed since 2014 MRI and without high risk features.  No follow-up imaging recommended.    Review of systems:     No chest pain, no SOB, no fevers, no urinary sx   Past Medical History:  Diagnosis Date   Endometrial cancer (HCC)    History of colon polyps    Hyperlipidemia    Osteopenia    Other and unspecified noninfectious gastroenteritis and colitis(558.9)    Ulcerative colitis (HCC)     Patient's surgical history, family medical history, social history, medications and allergies were all reviewed in Epic    Current Outpatient Medications  Medication Sig Dispense Refill   aspirin  EC 81 MG tablet Take 81 mg by mouth daily. Swallow whole.     Biotin 89999 MCG TABS Take 1 tablet by mouth daily.     calcium  carbonate (OS-CAL) 600 MG TABS Take 600 mg by mouth daily.     cholecalciferol (VITAMIN D) 1000 UNITS tablet Take 1,000 Units by mouth daily.     Evolocumab  (REPATHA  SURECLICK) 140 MG/ML SOAJ Inject 140 mg into the skin every 14 (fourteen) days. 6 mL 3   fenofibrate  (TRICOR ) 145 MG tablet TAKE 1 TABLET(145 MG) BY MOUTH DAILY 90 tablet 0   Lactobacillus (ACIDOPHILUS PO) Take by mouth. Probiotic     mesalamine  (LIALDA ) 1.2 g EC tablet TAKE 4 TABLETS BY MOUTH AT BREAKFAST. NEED FOLLOW UP APPOINTMENT FOR FUTURE REFILLS. PLEASE 120 tablet 1   Multiple Vitamin (MULTIVITAMIN) tablet Take 1 tablet by mouth daily.     OVER THE COUNTER MEDICATION Biotin 10,000 mcg. One tablet daily.  Psyllium (FIBER) 0.52 G CAPS Take 2 capsules by mouth daily.     rosuvastatin  (CRESTOR ) 40 MG tablet Take 1 tablet (40 mg total) by mouth daily. 90 tablet 3   TURMERIC PO Take by mouth.     zinc gluconate 50 MG tablet Take by mouth.     No current facility-administered medications for this visit.    Physical Exam:     There were no vitals taken for this visit.  GENERAL:  Pleasant female in NAD PSYCH: : Cooperative, normal  affect Musculoskeletal:  Normal muscle tone, normal strength NEURO: Alert and oriented x 3, no focal neurologic deficits   IMPRESSION and PLAN:    1) Ulcerative Colitis 2) Medication refill Longstanding history of UC diagnosed in the 1980s.  Has been in deep remission for many years on Lialda  monotherapy.  Discussed role/utility of decreasing dose to 2.4 g daily.  She was recently started on Repatha  and does not want to make too many medication adjustments at once, which is very reasonable. - Continue Lialda  4.8 g daily.  Refill to be sent in when low on current supply.  Can send me MyChart message to send in Rx 1-2 weeks prior to needing refill - Up-to-date on labs.  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  Lesion has been stable on serial imaging studies dating back to 2014.  Per most recent MRI report in 04/2023, lesion is stable and radiologist recommends no continued imaging.   - RTC in 2 years or sooner prn  I spent 30 minutes of time, including in depth chart review, independent review of results as outlined above, communicating results with the patient directly, face-to-face time with the patient, coordinating care, and ordering studies and medications as appropriate, and documentation.        Sandor GAILS Autumn Knight ,DO, FACG 08/03/2024, 1:17 PM

## 2024-08-03 NOTE — Patient Instructions (Signed)
 _______________________________________________________  If your blood pressure at your visit was 140/90 or greater, please contact your primary care physician to follow up on this.  _______________________________________________________  If you are age 71 or older, your body mass index should be between 23-30. Your Body mass index is 24.71 kg/m. If this is out of the aforementioned range listed, please consider follow up with your Primary Care Provider.  If you are age 74 or younger, your body mass index should be between 19-25. Your Body mass index is 24.71 kg/m. If this is out of the aformentioned range listed, please consider follow up with your Primary Care Provider.   ________________________________________________________  The [facility] providers would like to encourage you to use MYCHART to communicate with providers for non-urgent requests or questions.  Due to long hold times on the telephone, sending your provider a message by [facility] may be a faster and more efficient way to get a response.  Please allow 48 business hours for a response.  Please remember that this is for non-urgent requests.  _______________________________________________________  [facility] is using a team-based approach to care.  Your team is made up of your doctor and two to three APPS. Our APPS (Nurse Practitioners and Physician Assistants) work with your physician to ensure care continuity for you. They are fully qualified to address your health concerns and develop a treatment plan. They communicate directly with your gastroenterologist to care for you. Seeing the Advanced Practice Practitioners on your physician's team can help you by facilitating care more promptly, often allowing for earlier appointments, access to diagnostic testing, procedures, and other specialty referrals.   Please send our office a message when you need refills on Lialda .  It was a pleasure to see you today!  Vito  Cirigliano, D.O.

## 2024-08-20 ENCOUNTER — Other Ambulatory Visit: Payer: Self-pay | Admitting: Gastroenterology

## 2024-10-05 ENCOUNTER — Ambulatory Visit: Payer: Self-pay | Admitting: Pharmacist

## 2024-10-05 LAB — LIPOPROTEIN A (LPA): Lipoprotein (a): 235.3 nmol/L — ABNORMAL HIGH (ref <75.0)

## 2024-10-05 NOTE — Telephone Encounter (Signed)
 Call to discuss lab result and remind patient about follow up lipid lab. N/A LVM

## 2024-10-06 NOTE — Telephone Encounter (Signed)
 When patient went for lab last week forgot to grab Lipid panel requisite will go for FLP on Nov 24 .

## 2024-10-16 ENCOUNTER — Other Ambulatory Visit: Payer: Self-pay | Admitting: Cardiology

## 2024-10-16 LAB — LIPID PANEL

## 2024-10-17 NOTE — Telephone Encounter (Addendum)
 Lipid  and CMP lab results discussed over the phone- LDLc went from 83 mg/dl to 24 mg/dl . Advised to continue taking Crestor  40 mg daily and Repatha  140 mg every 2 weeks. Pt has not been taking fenofibrate 

## 2024-10-17 NOTE — Addendum Note (Signed)
 Addended by: Keagen Heinlen K on: 10/17/2024 11:02 AM   Modules accepted: Orders

## 2024-10-18 ENCOUNTER — Ambulatory Visit: Payer: Self-pay | Admitting: Cardiology

## 2024-10-18 DIAGNOSIS — E7849 Other hyperlipidemia: Secondary | ICD-10-CM

## 2024-10-18 LAB — LIPID PANEL
Cholesterol, Total: 128 mg/dL (ref 100–199)
HDL: 89 mg/dL (ref >39)
LDL CALC COMMENT:: 1.4 ratio (ref 0.0–4.4)
LDL Chol Calc (NIH): 24 mg/dL (ref 0–99)
Triglycerides: 79 mg/dL (ref 0–149)
VLDL Cholesterol Cal: 15 mg/dL (ref 5–40)

## 2024-10-18 LAB — COMPREHENSIVE METABOLIC PANEL WITH GFR
ALT: 35 IU/L — AB (ref 0–32)
AST: 33 IU/L (ref 0–40)
Albumin: 4.5 g/dL (ref 3.8–4.8)
Alkaline Phosphatase: 54 IU/L (ref 49–135)
BUN/Creatinine Ratio: 13 (ref 12–28)
BUN: 11 mg/dL (ref 8–27)
Bilirubin Total: 0.5 mg/dL (ref 0.0–1.2)
CO2: 21 mmol/L (ref 20–29)
Calcium: 10.1 mg/dL (ref 8.7–10.3)
Chloride: 106 mmol/L (ref 96–106)
Creatinine, Ser: 0.88 mg/dL (ref 0.57–1.00)
Globulin, Total: 2.8 g/dL (ref 1.5–4.5)
Glucose: 93 mg/dL (ref 70–99)
Potassium: 4.2 mmol/L (ref 3.5–5.2)
Sodium: 142 mmol/L (ref 134–144)
Total Protein: 7.3 g/dL (ref 6.0–8.5)
eGFR: 70 mL/min/1.73 (ref >59)

## 2024-10-18 LAB — LIPOPROTEIN A (LPA): Lipoprotein (a): 261.5 nmol/L — AB (ref <75.0)

## 2024-10-23 NOTE — Telephone Encounter (Signed)
 Lp(a) and CMP discussed with patient. Alt slightly elevated. Will get f/u lab early spring when patient come in to see Dr.Tolia.

## 2024-10-26 NOTE — Addendum Note (Signed)
 Addended by: Jahnae Mcadoo K on: 10/26/2024 01:47 PM   Modules accepted: Orders

## 2024-11-01 ENCOUNTER — Encounter: Payer: Self-pay | Admitting: Cardiology

## 2024-11-20 LAB — LAB REPORT - SCANNED: EGFR: 88

## 2024-11-27 ENCOUNTER — Ambulatory Visit: Payer: Self-pay | Admitting: Cardiology

## 2025-01-10 ENCOUNTER — Ambulatory Visit: Admitting: Cardiology
